# Patient Record
Sex: Female | Born: 1950 | Race: White | Hispanic: No | Marital: Married | Smoking: Never smoker
Health system: Southern US, Community
[De-identification: ages and names within clinical notes are randomized; demographics above are authoritative.]

---

## 1999-04-14 ENCOUNTER — Encounter: Payer: Self-pay | Admitting: Family Medicine

## 1999-04-14 ENCOUNTER — Encounter: Admission: RE | Admit: 1999-04-14 | Discharge: 1999-04-14 | Payer: Self-pay | Admitting: Family Medicine

## 2000-04-06 ENCOUNTER — Encounter: Admission: RE | Admit: 2000-04-06 | Discharge: 2000-04-06 | Payer: Self-pay | Admitting: Family Medicine

## 2000-04-06 ENCOUNTER — Encounter: Payer: Self-pay | Admitting: Family Medicine

## 2001-08-09 ENCOUNTER — Encounter: Admission: RE | Admit: 2001-08-09 | Discharge: 2001-08-09 | Payer: Self-pay | Admitting: Family Medicine

## 2001-08-09 ENCOUNTER — Encounter: Payer: Self-pay | Admitting: Family Medicine

## 2001-10-22 ENCOUNTER — Encounter: Payer: Self-pay | Admitting: Family Medicine

## 2001-10-22 ENCOUNTER — Encounter: Admission: RE | Admit: 2001-10-22 | Discharge: 2001-10-22 | Payer: Self-pay | Admitting: Family Medicine

## 2001-10-23 ENCOUNTER — Encounter: Payer: Self-pay | Admitting: Internal Medicine

## 2001-10-23 ENCOUNTER — Ambulatory Visit (HOSPITAL_COMMUNITY): Admission: RE | Admit: 2001-10-23 | Discharge: 2001-10-23 | Payer: Self-pay | Admitting: Internal Medicine

## 2002-02-05 ENCOUNTER — Encounter: Admission: RE | Admit: 2002-02-05 | Discharge: 2002-02-05 | Payer: Self-pay | Admitting: Neurological Surgery

## 2002-02-05 ENCOUNTER — Encounter: Payer: Self-pay | Admitting: Neurological Surgery

## 2002-12-29 ENCOUNTER — Ambulatory Visit (HOSPITAL_COMMUNITY): Admission: RE | Admit: 2002-12-29 | Discharge: 2002-12-29 | Payer: Self-pay | Admitting: Neurological Surgery

## 2002-12-29 ENCOUNTER — Encounter: Payer: Self-pay | Admitting: Neurological Surgery

## 2003-02-02 ENCOUNTER — Inpatient Hospital Stay (HOSPITAL_COMMUNITY): Admission: RE | Admit: 2003-02-02 | Discharge: 2003-02-06 | Payer: Self-pay | Admitting: Neurological Surgery

## 2004-03-29 ENCOUNTER — Other Ambulatory Visit: Admission: RE | Admit: 2004-03-29 | Discharge: 2004-03-29 | Payer: Self-pay | Admitting: Family Medicine

## 2004-03-29 ENCOUNTER — Encounter: Admission: RE | Admit: 2004-03-29 | Discharge: 2004-03-29 | Payer: Self-pay | Admitting: Family Medicine

## 2004-04-05 ENCOUNTER — Ambulatory Visit (HOSPITAL_COMMUNITY): Admission: RE | Admit: 2004-04-05 | Discharge: 2004-04-05 | Payer: Self-pay | Admitting: Family Medicine

## 2006-01-31 ENCOUNTER — Ambulatory Visit: Payer: Self-pay | Admitting: Internal Medicine

## 2006-02-14 ENCOUNTER — Ambulatory Visit: Payer: Self-pay | Admitting: Internal Medicine

## 2009-02-10 ENCOUNTER — Encounter: Admission: RE | Admit: 2009-02-10 | Discharge: 2009-02-10 | Payer: Self-pay | Admitting: Family Medicine

## 2010-04-16 ENCOUNTER — Encounter: Payer: Self-pay | Admitting: Family Medicine

## 2010-08-12 NOTE — Discharge Summary (Signed)
   NAME:  Abigail Mullins, Abigail Mullins                  ACCOUNT NO.:  [id number]   MEDICAL RECORD NO.:  [id number]                   PATIENT TYPE:  INP   LOCATION:  3007                                 FACILITY:  [facility]   PHYSICIAN:  Stefani Dama, M.D.               DATE OF BIRTH:  04/15/1950   DATE OF ADMISSION:  02/02/2003  DATE OF DISCHARGE:  02/06/2003                                 DISCHARGE SUMMARY   ADMISSION DIAGNOSES:  1. 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  2. Status post arthrodesis T12-L4 for L1 burst fracture in 1988.   DISCHARGE DIAGNOSES/FINAL DIAGNOSES:  1. 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  2. Status post arthrodesis T12-L4 for L1 burst fracture in 1988.   PROCEDURE:  Exploration of arthrodesis T12 to L4 with removal of Harrington  hardware from T12 to L4.  Decompression of lumbar stenosis and posterior  interbody arthrodesis using Saber carbon fiber cages and local Autograft, L4-  L5, posterior lateral arthrodesis with local Autograft and Allograft.  Pedicle screw fixation, non-segmental L4, L5 on February 06, 2003.   CONDITION ON DISCHARGE:  Improving.   HOSPITAL COURSE:  The patient is a 60 year old individual who has had  significant back pain and bilateral leg pain.  She has had significant  problems with her lumbar spine having suffered a compression fracture at the  L1 vertebrae years ago.  She underwent posterior fixation and fusion with  Harrington rod technique.  She recovered from this well, however, she  developed problems at L5-S1 a few years ago and she underwent decompression  of this.  She also had a synovial cyst develop at L4-L5 and this was  decompressed a few years ago.  She did well in the interim, however, she has  developed recurrent back pain and leg pain and had severe spondylitic  disease at L4-L5.  She has been advised regarding surgical decompression and  stabilization, however this would require removal of her Harrington  hardware.  This was performed on the 8th.  She tolerated this procedure well  and postoperatively she has been doing reasonably well.  Her incision is  clean and dry and at this time she is managed with Percocet for pain.  She  will be seen in the office in two weeks' time.  Her incision is clean and  dry and she continues to improve steadily.                                                Stefani Dama, M.D.    Merla Riches  D:  02/05/2003  T:  02/06/2003  Job:  [id number]

## 2010-08-12 NOTE — Op Note (Signed)
NAME:  LILEIGH, FAHRINGER                  ACCOUNT NO.:  [id number]   MEDICAL RECORD NO.:  [id number]                   PATIENT TYPE:  INP   LOCATION:  2899                                 FACILITY:  [facility]   PHYSICIAN:  Stefani Dama, M.D.               DATE OF BIRTH:  07/21/1950   DATE OF PROCEDURE:  02/02/2003  DATE OF DISCHARGE:                                 OPERATIVE REPORT   PREOPERATIVE DIAGNOSES:  1. L4-L5 spondylosis and stenosis.  2. Lumbar radiculopathy and back pain.  3. Status post arthrodesis T12 to L4 for L1 burst fracture in 1988.   POSTOPERATIVE DIAGNOSES:  1. L4-L5 spondylosis and stenosis.  2. Lumbar radiculopathy and back pain.  3. Status post arthrodesis T12 to L4 for L1 burst fracture in 1988.   PROCEDURE:  1. Exploration of arthrodesis T12 to L4.  2. Removal of Harrington hardware.  3. Decompression of lumbar stenosis with posterior interbody arthrodesis     using Saber carbon fiber cages and local autograft.  4. Pedicle screw fixation L4-L5.  5. Posterior lateral arthrodesis with local autograft and allograft.   SURGEON:  Stefani Dama, M.D.   ASSISTANT:  Cristi Loron, M.D.   ANESTHESIA:  General endotracheal.   INDICATIONS FOR PROCEDURE:  The patient is a 60 year old individual who has  had significant back and bilateral leg pain.  She has previously had surgery  to stabilize and fuse from T11 down to L4 for an L1 compression fracture.  She has also had an arthrodesis at L5-S1 with a cage technique.  She has had  progressive spondylitic disease, and has had a previous resection of a  synovial cyst at the level of L4-L5.  She did well after each of these  operations; however, she has had recurrent back pain and bilateral leg pain,  and now has severe spondylitic stenosis at L4-L5.  She has been advised  regarding surgery, and I have advised that she undergo a removal of the  Harrington rods and then stabilization and fixation at  L4-L5 using a  posterior interbody technique and a posterolateral arthrodesis after placing  pedicle screws.   DESCRIPTION OF PROCEDURE:  The patient was brought to the operating room  supine on a stretcher.  After the smooth induction of general endotracheal  anesthesia, she was turned prone.  The back was shaved and prepped with  Duraprep and draped in a sterile fashion.  An elliptical incision was made  around the previous scar and this was excised.  The dissection was then  carried down to the subcutaneous tissues and the lumbodorsal fascia was then  opened on either side of the midline.  This exposed the fusion mass in the  posterior aspect of her spine, and then ultimately we found the rod on one  side, and dissected this down to expose the rod, and at the level of the L4  vertebra.  The dissection was then carried  out cephalad to expose the rod  hooks at the T11 vertebra.  Care was taken to dissect around the rods, and  then an osteotome was used to release bony growth that had incurred into the  rods and into the hooks.  By gradually releasing these keepers, the superior  hooks were identified.  These were expanded and then removed.  Then by  placing a hook holder, the hook could be maneuvered to unratchet the rod,  and then using a combination of curets, rongeurs and osteotomes, the rod was  gradually dislodged in the extension position through the superior rod, and  then removed.  Then the hooks were each removed on one side.  A similar  procedure was then carried out on the other side.  During this maneuver, the  arthrodesis could be explored, and it was noted that there was a solid  fusion all the way from T11 down to the L4 vertebra.  Once the hardware was  removed, the area was packed off and an exploration of the interbody space  at L4-L5 was then undertaken.  A significant overgrowth of ligament of  flavum and facet at this level had occurred.  This was first opened up on   the right side, where the ligamentum was identified and carefully dissected  to expose the common dural tube and the takeoff of the L5 nerve root as it  went under the foramen.  This area was then decompressed fully in the  cephalad position, and the bone from this decompression was saved for later  use in the arthrodesis.  This was first started on the patient's left-hand  side, and then proceeded on the patient's right-hand side.  A decompression  was done bilaterally so that the disk space could then be isolated.  Then a  diskectomy was performed, first on the right side, then a significant  quantity of severely degenerated disk material was removed from within the  disk space, and this allowed placement of a self-retaining disk spreader  into that side.  The contralateral side then underwent a diskectomy.  This  area was similarly decompressed fully.  Then by working from side to side,  the disk space was completely evacuated of a significant quantity of  severely degenerated disk material.  The disk material was removed both from  medially and laterally, all the way until the posterior longitudinal  ligament could be identified around the vertebrae.  Once this was performed,  the end plates were curetted until smooth, and the bony end plates were then  prepared for grafting.  A series of rasps were then used both medially and  laterally, to make sure that all the remnants of cartilagineus material had  been removed.  Once this was secured, the attention was then turned to sizing the  appropriate end plate.  It was felt that a 9 mm trial allowed for some snug  placement of the disk space, with a good maintenance of the disk height.  Two 9 mm x 25 mm end plates were then selected and impacted, once they were  packed with the patient's own bone.  The patient also used the Symphony system which allowed for a platelet-rich glue to be mixed with autograft and  allograft, and this was then  packed into each of the cages.  The interspace  was then also packed with the patient's autograft and some of the Symphony  allograft.  The interbody space was then filled with this material.  The  medical entry sites were then chosen at L4 and L5.  These were checked  radiographically.  When felt to be adequate, each of the pedicle holes was  then tapped with the 5.5 mm tap, and then 6.5 mm screws were placed in the  holes.  On the upper left hole at L4 the 6.5 screw was noted to have  stripped out, and the 7.0 mm screw was used.  These were then secured with  the appropriate end plates and caps.  Prior to doing this the lateral  gutters, which had earlier been decorticated and packed off, were packed  with the remainder of the autograft and allograft.  The screw caps were  applied, and then placed in slight compression.  This allowed for a good  reduction and anatomic appearance to the construct.  The system was then  locked down fully.  The soft tissues were then carefully checked for  hemostasis.  The area was copiously irrigated with antibiotic irrigating  solution.  The remainder of the bone graft was then placed into the bed  where the previous rods had been removed from, and then the lumbodorsal  fascia was closed with #1 Vicryl in an interrupted fashion, and #2-0 Vicryl  was used in the subcutaneous and subcuticular tissues, and #3-0 Vicryl was  used to close the subcuticular skin.  The patient tolerated the procedure well and was returned to the recovery  room in stable condition.  One unit Cell-Saver blood was returned to the  patient.                                               Stefani Dama, M.D.    Merla Riches  D:  02/02/2003  T:  02/02/2003  Job:  [id number]

## 2011-03-30 ENCOUNTER — Encounter: Payer: Self-pay | Admitting: Internal Medicine

## 2011-12-20 ENCOUNTER — Encounter: Payer: Self-pay | Admitting: Internal Medicine

## 2013-12-22 ENCOUNTER — Other Ambulatory Visit (HOSPITAL_COMMUNITY): Payer: Self-pay | Admitting: Family Medicine

## 2013-12-22 ENCOUNTER — Encounter (HOSPITAL_COMMUNITY): Payer: Self-pay

## 2013-12-22 ENCOUNTER — Ambulatory Visit (HOSPITAL_COMMUNITY)
Admission: RE | Admit: 2013-12-22 | Discharge: 2013-12-22 | Disposition: A | Discharge status: Home / Self Care | Payer: MEDICARE | Source: Ambulatory Visit | Attending: Family Medicine | Admitting: Family Medicine

## 2013-12-22 ENCOUNTER — Other Ambulatory Visit: Payer: Self-pay | Admitting: Family Medicine

## 2013-12-22 DIAGNOSIS — R197 Diarrhea, unspecified: Secondary | ICD-10-CM | POA: Insufficient documentation

## 2013-12-22 DIAGNOSIS — R1031 Right lower quadrant pain: Secondary | ICD-10-CM | POA: Diagnosis present

## 2013-12-22 DIAGNOSIS — B029 Zoster without complications: Secondary | ICD-10-CM | POA: Diagnosis not present

## 2013-12-22 MED ORDER — IOHEXOL 300 MG/ML  SOLN
100.0000 mL | Freq: Once | INTRAMUSCULAR | Status: AC | PRN
  Administered 2013-12-22: 100 mL via INTRAVENOUS

## 2014-02-16 ENCOUNTER — Other Ambulatory Visit (HOSPITAL_COMMUNITY): Payer: Self-pay | Admitting: Family Medicine

## 2014-02-16 DIAGNOSIS — J984 Other disorders of lung: Secondary | ICD-10-CM

## 2014-03-03 ENCOUNTER — Ambulatory Visit (HOSPITAL_COMMUNITY): Payer: BC Managed Care – PPO

## 2014-05-12 ENCOUNTER — Other Ambulatory Visit: Payer: Self-pay | Admitting: Family Medicine

## 2014-05-12 DIAGNOSIS — R911 Solitary pulmonary nodule: Secondary | ICD-10-CM

## 2014-05-18 ENCOUNTER — Ambulatory Visit
Admission: RE | Admit: 2014-05-18 | Discharge: 2014-05-18 | Disposition: A | Discharge status: Home / Self Care | Payer: Medicare Other | Source: Ambulatory Visit | Attending: Family Medicine | Admitting: Family Medicine

## 2014-05-18 DIAGNOSIS — R911 Solitary pulmonary nodule: Secondary | ICD-10-CM

## 2015-09-29 ENCOUNTER — Other Ambulatory Visit: Payer: Self-pay | Admitting: Family Medicine

## 2015-09-29 DIAGNOSIS — Z1231 Encounter for screening mammogram for malignant neoplasm of breast: Secondary | ICD-10-CM

## 2015-10-05 ENCOUNTER — Ambulatory Visit
Admission: RE | Admit: 2015-10-05 | Discharge: 2015-10-05 | Disposition: A | Discharge status: Home / Self Care | Payer: Medicare Other | Source: Ambulatory Visit | Attending: Family Medicine | Admitting: Family Medicine

## 2015-10-05 DIAGNOSIS — Z1231 Encounter for screening mammogram for malignant neoplasm of breast: Secondary | ICD-10-CM | POA: Diagnosis not present

## 2015-10-07 DIAGNOSIS — M479 Spondylosis, unspecified: Secondary | ICD-10-CM | POA: Diagnosis not present

## 2016-11-22 IMAGING — CT CT CHEST W/O CM
1 of 4 series · 15 of 30 positions shown, 19 images · non-contrast
Comparison: 12/22/2013

CLINICAL DATA: Followup left lower lobe lung nodule

EXAM:
CT CHEST WITHOUT CONTRAST
TECHNIQUE: Multidetector CT imaging of the chest was performed following the
standard protocol without IV contrast..

[Series 2: thin chest · axial · 0.64mm/px · z∈[-300,+4]mm · 15 of 348 slices shown, 19 images]
[im 22/348  mediastinal]
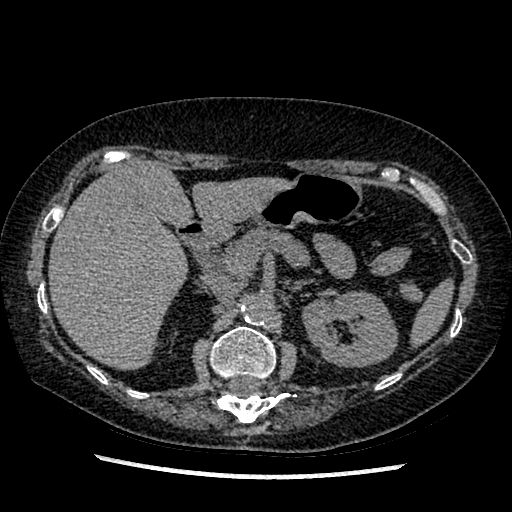
[im 22/348  lung]
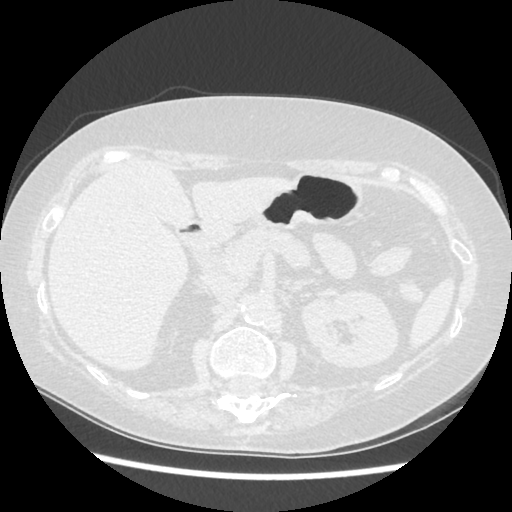
[im 44/348  lung]
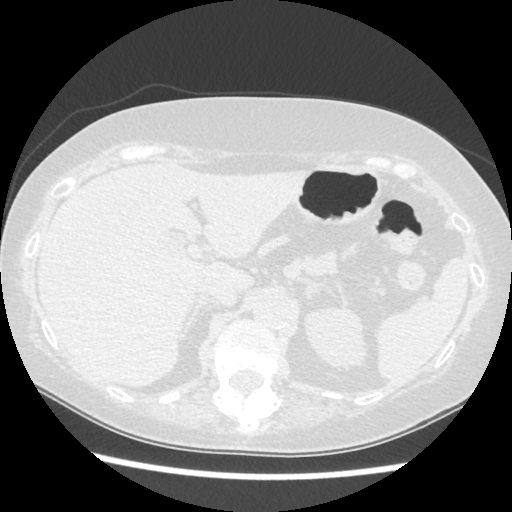
[im 66/348  lung]
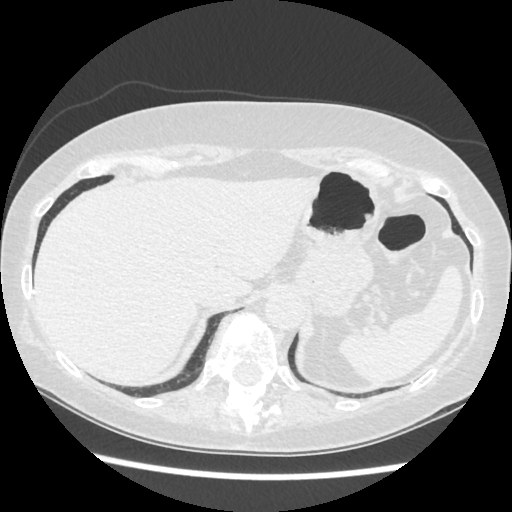
[im 87/348  lung]
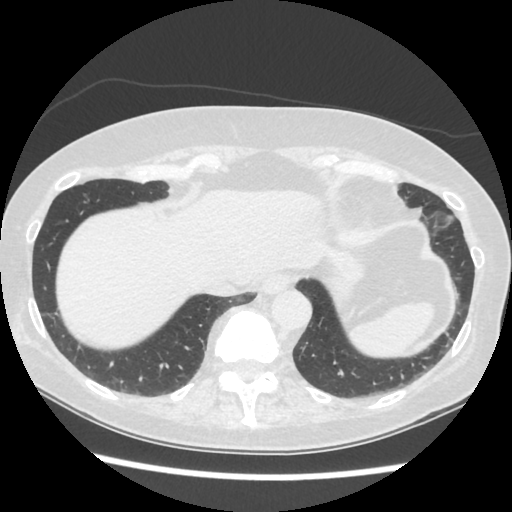
[im 109/348  mediastinal]
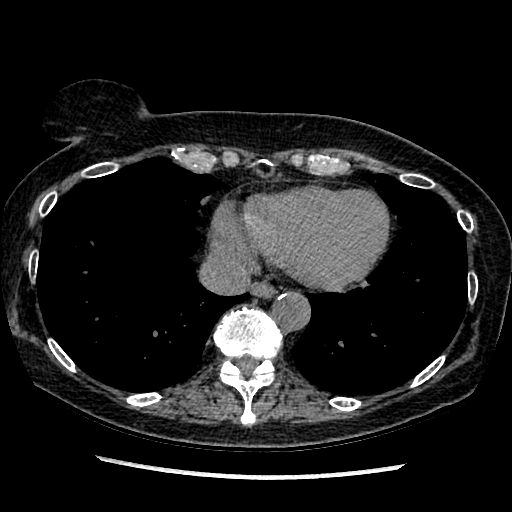
[im 109/348  lung]
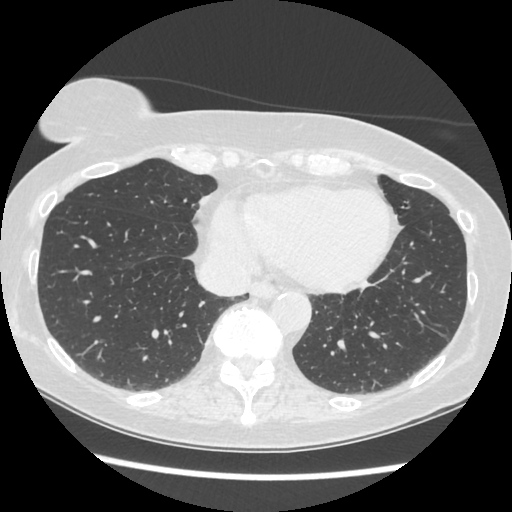
[im 131/348  lung]
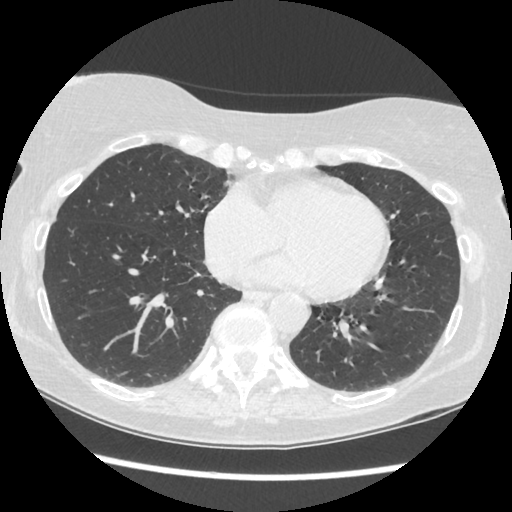
[im 152/348  lung]
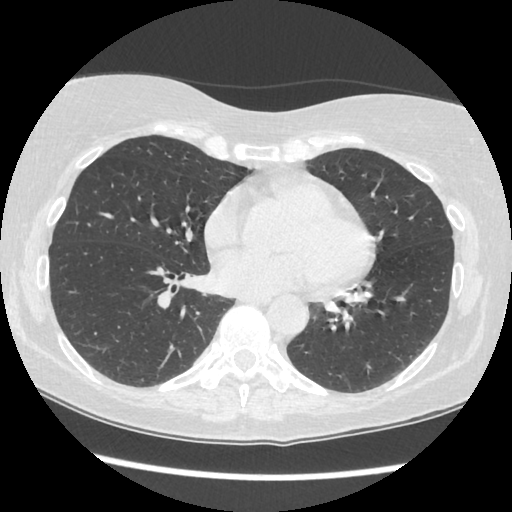
[im 174/348  lung]
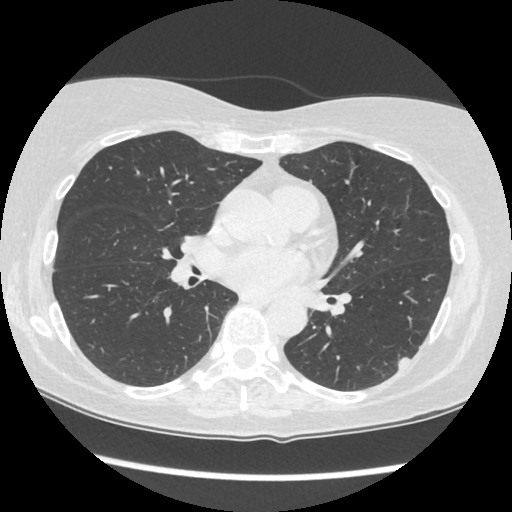
[im 196/348  mediastinal]
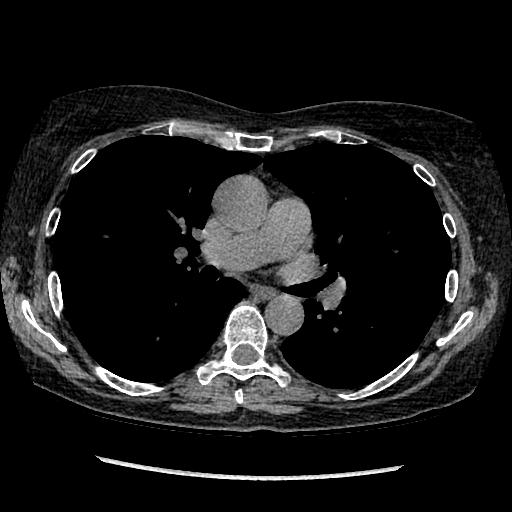
[im 196/348  lung]
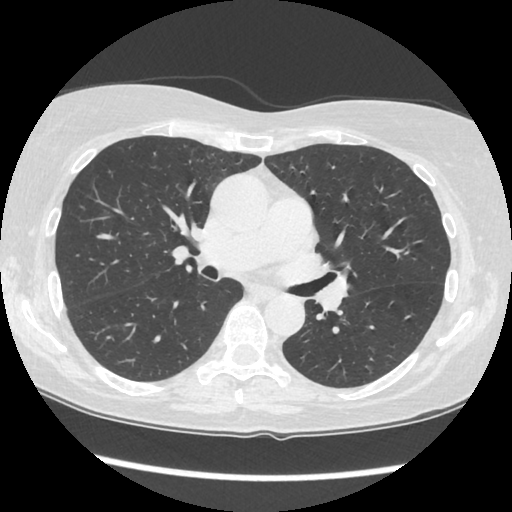
[im 217/348  lung]
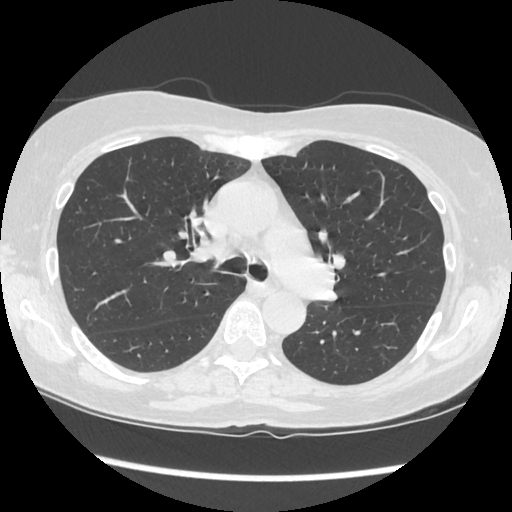
[im 239/348  lung]
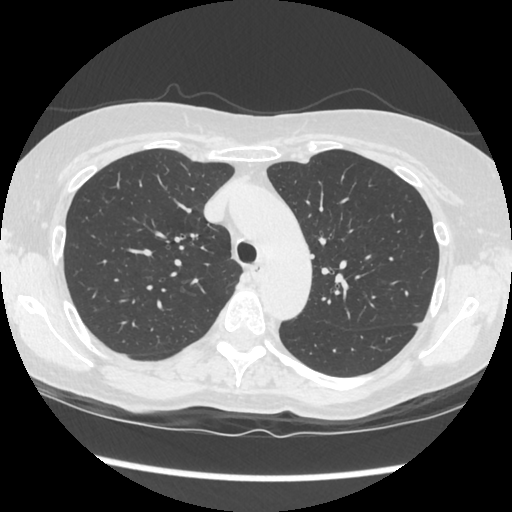
[im 261/348  lung]
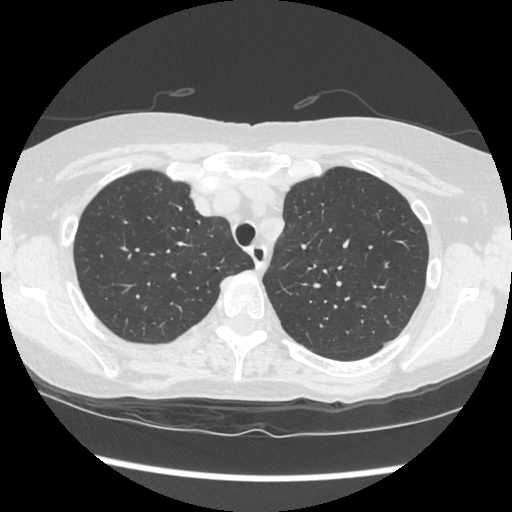
[im 282/348  mediastinal]
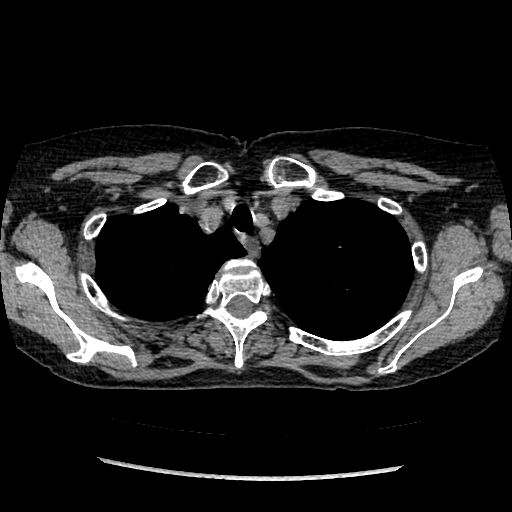
[im 282/348  lung]
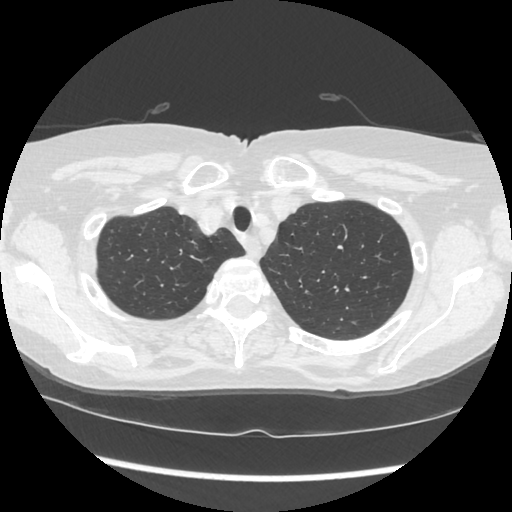
[im 304/348  lung]
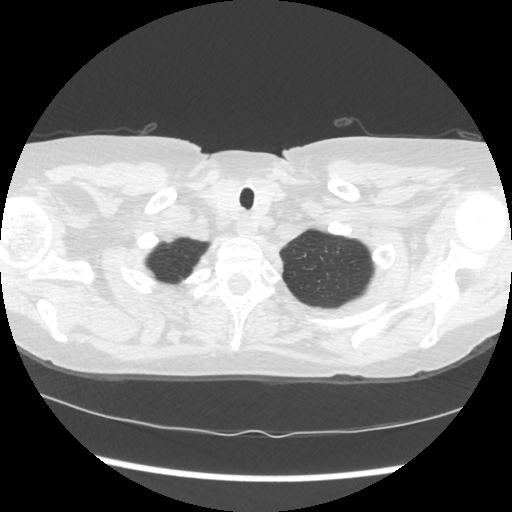
[im 326/348  lung]
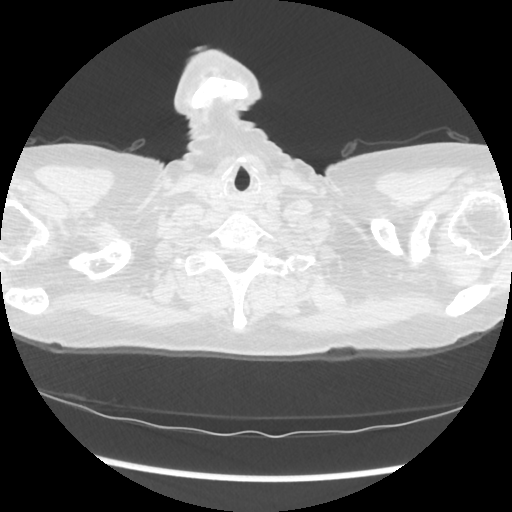

[15 of 30 positions shown; findings below may reference images not displayed]

FINDINGS: The lungs are well aerated bilaterally. The previously seen
subpleural nodule is again noted in the left lower lobe measuring
1.2 cm. No significant interval change is noted. A tiny noncalcified
nodule is noted in the right upper lobe best seen on image number 13
of series 4. It measures approximately 3 mm. No other significant
nodules are seen. The hilar and mediastinal structures show no
significant lymphadenopathy. Aortic and coronary calcifications are
seen.

Scanning into the upper abdomen reveals no acute abnormality.
Degenerative changes in the thoracic spine are noted.
IMPRESSION: Stable left lower lobe subpleural nodule. Continued followup in 1
year is recommended to assess for stability.

3 mm nodule in the right upper lobe as described. If the patient is
at high risk for bronchogenic carcinoma, follow-up chest CT at 1
year is recommended. If the patient is at low risk, no follow-up is
needed. This recommendation follows the consensus statement:
Guidelines for Management of Small Pulmonary Nodules Detected on CT
Scans: A Statement from the [HOSPITAL] as published in
Radiology 0666; [DATE]. This can be performed at the same
setting for evaluation of the larger nodule.

## 2016-11-28 ENCOUNTER — Other Ambulatory Visit: Payer: Self-pay | Admitting: Family Medicine

## 2016-11-28 DIAGNOSIS — Z1231 Encounter for screening mammogram for malignant neoplasm of breast: Secondary | ICD-10-CM

## 2016-12-07 ENCOUNTER — Ambulatory Visit: Payer: Medicare Other

## 2017-01-24 DIAGNOSIS — Z7189 Other specified counseling: Secondary | ICD-10-CM | POA: Diagnosis not present

## 2017-01-24 DIAGNOSIS — M479 Spondylosis, unspecified: Secondary | ICD-10-CM | POA: Diagnosis not present

## 2017-05-21 DIAGNOSIS — R69 Illness, unspecified: Secondary | ICD-10-CM | POA: Diagnosis not present

## 2017-10-17 DIAGNOSIS — Z0000 Encounter for general adult medical examination without abnormal findings: Secondary | ICD-10-CM | POA: Diagnosis not present

## 2018-01-24 DIAGNOSIS — Z7189 Other specified counseling: Secondary | ICD-10-CM | POA: Diagnosis not present

## 2018-02-01 DIAGNOSIS — Z23 Encounter for immunization: Secondary | ICD-10-CM | POA: Diagnosis not present

## 2018-05-30 DIAGNOSIS — L821 Other seborrheic keratosis: Secondary | ICD-10-CM | POA: Diagnosis not present

## 2018-05-30 DIAGNOSIS — C44319 Basal cell carcinoma of skin of other parts of face: Secondary | ICD-10-CM | POA: Diagnosis not present

## 2018-05-31 DIAGNOSIS — I10 Essential (primary) hypertension: Secondary | ICD-10-CM | POA: Diagnosis not present

## 2018-08-06 DIAGNOSIS — Z85828 Personal history of other malignant neoplasm of skin: Secondary | ICD-10-CM | POA: Diagnosis not present

## 2018-08-06 DIAGNOSIS — C44319 Basal cell carcinoma of skin of other parts of face: Secondary | ICD-10-CM | POA: Diagnosis not present

## 2018-10-17 DIAGNOSIS — M10072 Idiopathic gout, left ankle and foot: Secondary | ICD-10-CM | POA: Diagnosis not present

## 2018-10-17 DIAGNOSIS — M79672 Pain in left foot: Secondary | ICD-10-CM | POA: Diagnosis not present

## 2018-10-17 DIAGNOSIS — G5762 Lesion of plantar nerve, left lower limb: Secondary | ICD-10-CM | POA: Diagnosis not present

## 2018-12-13 DIAGNOSIS — R5383 Other fatigue: Secondary | ICD-10-CM | POA: Diagnosis not present

## 2018-12-13 DIAGNOSIS — Z1211 Encounter for screening for malignant neoplasm of colon: Secondary | ICD-10-CM | POA: Diagnosis not present

## 2018-12-13 DIAGNOSIS — I10 Essential (primary) hypertension: Secondary | ICD-10-CM | POA: Diagnosis not present

## 2018-12-13 DIAGNOSIS — Z1231 Encounter for screening mammogram for malignant neoplasm of breast: Secondary | ICD-10-CM | POA: Diagnosis not present

## 2019-01-15 DIAGNOSIS — M47816 Spondylosis without myelopathy or radiculopathy, lumbar region: Secondary | ICD-10-CM | POA: Diagnosis not present

## 2019-01-15 DIAGNOSIS — I10 Essential (primary) hypertension: Secondary | ICD-10-CM | POA: Diagnosis not present

## 2019-02-12 DIAGNOSIS — Z1211 Encounter for screening for malignant neoplasm of colon: Secondary | ICD-10-CM | POA: Diagnosis not present

## 2019-02-13 DIAGNOSIS — L821 Other seborrheic keratosis: Secondary | ICD-10-CM | POA: Diagnosis not present

## 2019-02-13 DIAGNOSIS — L814 Other melanin hyperpigmentation: Secondary | ICD-10-CM | POA: Diagnosis not present

## 2019-02-13 DIAGNOSIS — Z85828 Personal history of other malignant neoplasm of skin: Secondary | ICD-10-CM | POA: Diagnosis not present

## 2019-02-13 DIAGNOSIS — C44612 Basal cell carcinoma of skin of right upper limb, including shoulder: Secondary | ICD-10-CM | POA: Diagnosis not present

## 2019-02-13 DIAGNOSIS — L570 Actinic keratosis: Secondary | ICD-10-CM | POA: Diagnosis not present

## 2019-02-26 ENCOUNTER — Other Ambulatory Visit: Payer: Self-pay | Admitting: Family Medicine

## 2019-02-26 DIAGNOSIS — Z1231 Encounter for screening mammogram for malignant neoplasm of breast: Secondary | ICD-10-CM

## 2019-05-27 DIAGNOSIS — Z85828 Personal history of other malignant neoplasm of skin: Secondary | ICD-10-CM | POA: Diagnosis not present

## 2019-05-27 DIAGNOSIS — L570 Actinic keratosis: Secondary | ICD-10-CM | POA: Diagnosis not present

## 2019-05-27 DIAGNOSIS — L821 Other seborrheic keratosis: Secondary | ICD-10-CM | POA: Diagnosis not present

## 2019-05-27 DIAGNOSIS — C44612 Basal cell carcinoma of skin of right upper limb, including shoulder: Secondary | ICD-10-CM | POA: Diagnosis not present

## 2019-06-16 DIAGNOSIS — Z1231 Encounter for screening mammogram for malignant neoplasm of breast: Secondary | ICD-10-CM | POA: Diagnosis not present

## 2019-06-16 DIAGNOSIS — I10 Essential (primary) hypertension: Secondary | ICD-10-CM | POA: Diagnosis not present

## 2019-06-16 DIAGNOSIS — R140 Abdominal distension (gaseous): Secondary | ICD-10-CM | POA: Diagnosis not present

## 2019-06-16 DIAGNOSIS — R7301 Impaired fasting glucose: Secondary | ICD-10-CM | POA: Diagnosis not present

## 2019-07-18 DIAGNOSIS — H02889 Meibomian gland dysfunction of unspecified eye, unspecified eyelid: Secondary | ICD-10-CM | POA: Diagnosis not present

## 2019-07-18 DIAGNOSIS — H2513 Age-related nuclear cataract, bilateral: Secondary | ICD-10-CM | POA: Diagnosis not present

## 2019-07-18 DIAGNOSIS — H15001 Unspecified scleritis, right eye: Secondary | ICD-10-CM | POA: Diagnosis not present

## 2019-07-21 ENCOUNTER — Ambulatory Visit: Payer: Medicare Other

## 2019-08-07 ENCOUNTER — Other Ambulatory Visit: Payer: Self-pay

## 2019-08-07 ENCOUNTER — Ambulatory Visit
Admission: RE | Admit: 2019-08-07 | Discharge: 2019-08-07 | Disposition: A | Discharge status: Home / Self Care | Payer: Medicare Other | Source: Ambulatory Visit | Attending: Family Medicine | Admitting: Family Medicine

## 2019-08-07 DIAGNOSIS — Z1231 Encounter for screening mammogram for malignant neoplasm of breast: Secondary | ICD-10-CM

## 2019-12-29 DIAGNOSIS — Z1211 Encounter for screening for malignant neoplasm of colon: Secondary | ICD-10-CM | POA: Diagnosis not present

## 2019-12-29 DIAGNOSIS — Z0000 Encounter for general adult medical examination without abnormal findings: Secondary | ICD-10-CM | POA: Diagnosis not present

## 2019-12-29 DIAGNOSIS — I10 Essential (primary) hypertension: Secondary | ICD-10-CM | POA: Diagnosis not present

## 2019-12-29 DIAGNOSIS — R7301 Impaired fasting glucose: Secondary | ICD-10-CM | POA: Diagnosis not present

## 2020-01-14 DIAGNOSIS — Z7189 Other specified counseling: Secondary | ICD-10-CM | POA: Diagnosis not present

## 2020-02-11 DIAGNOSIS — I10 Essential (primary) hypertension: Secondary | ICD-10-CM | POA: Diagnosis not present

## 2020-02-11 DIAGNOSIS — Z9889 Other specified postprocedural states: Secondary | ICD-10-CM | POA: Diagnosis not present

## 2020-02-11 DIAGNOSIS — G894 Chronic pain syndrome: Secondary | ICD-10-CM | POA: Diagnosis not present

## 2020-05-11 DIAGNOSIS — I10 Essential (primary) hypertension: Secondary | ICD-10-CM | POA: Diagnosis not present

## 2020-05-11 DIAGNOSIS — M479 Spondylosis, unspecified: Secondary | ICD-10-CM | POA: Diagnosis not present

## 2020-05-11 DIAGNOSIS — G894 Chronic pain syndrome: Secondary | ICD-10-CM | POA: Diagnosis not present

## 2020-05-11 DIAGNOSIS — Z9889 Other specified postprocedural states: Secondary | ICD-10-CM | POA: Diagnosis not present

## 2020-05-24 DIAGNOSIS — H02889 Meibomian gland dysfunction of unspecified eye, unspecified eyelid: Secondary | ICD-10-CM | POA: Diagnosis not present

## 2020-05-24 DIAGNOSIS — H15001 Unspecified scleritis, right eye: Secondary | ICD-10-CM | POA: Diagnosis not present

## 2020-05-24 DIAGNOSIS — H2513 Age-related nuclear cataract, bilateral: Secondary | ICD-10-CM | POA: Diagnosis not present

## 2020-06-17 DIAGNOSIS — I10 Essential (primary) hypertension: Secondary | ICD-10-CM | POA: Diagnosis not present

## 2020-06-17 DIAGNOSIS — R350 Frequency of micturition: Secondary | ICD-10-CM | POA: Diagnosis not present

## 2020-06-17 DIAGNOSIS — R0981 Nasal congestion: Secondary | ICD-10-CM | POA: Diagnosis not present

## 2020-06-17 DIAGNOSIS — R5383 Other fatigue: Secondary | ICD-10-CM | POA: Diagnosis not present

## 2020-06-23 ENCOUNTER — Telehealth: Payer: Self-pay | Admitting: *Deleted

## 2020-06-23 NOTE — Telephone Encounter (Signed)
Per referral Dr. Manus Gunning - called and gave upcoming appointment and mailed calendar

## 2020-07-23 ENCOUNTER — Inpatient Hospital Stay: Payer: Medicare Other | Attending: Family | Admitting: Family

## 2020-07-23 ENCOUNTER — Inpatient Hospital Stay: Payer: Medicare Other

## 2020-07-23 ENCOUNTER — Other Ambulatory Visit: Payer: Self-pay

## 2020-07-23 ENCOUNTER — Other Ambulatory Visit: Payer: Self-pay | Admitting: Family

## 2020-07-23 VITALS — BP 132/71 | HR 81

## 2020-07-23 DIAGNOSIS — Z803 Family history of malignant neoplasm of breast: Secondary | ICD-10-CM | POA: Insufficient documentation

## 2020-07-23 DIAGNOSIS — Z808 Family history of malignant neoplasm of other organs or systems: Secondary | ICD-10-CM | POA: Insufficient documentation

## 2020-07-23 DIAGNOSIS — D751 Secondary polycythemia: Secondary | ICD-10-CM

## 2020-07-23 DIAGNOSIS — F17210 Nicotine dependence, cigarettes, uncomplicated: Secondary | ICD-10-CM | POA: Diagnosis not present

## 2020-07-23 DIAGNOSIS — I10 Essential (primary) hypertension: Secondary | ICD-10-CM | POA: Insufficient documentation

## 2020-07-23 DIAGNOSIS — R718 Other abnormality of red blood cells: Secondary | ICD-10-CM | POA: Insufficient documentation

## 2020-07-23 DIAGNOSIS — Z85828 Personal history of other malignant neoplasm of skin: Secondary | ICD-10-CM | POA: Insufficient documentation

## 2020-07-23 LAB — CBC WITH DIFFERENTIAL (CANCER CENTER ONLY)
Abs Immature Granulocytes: 0.04 10*3/uL (ref 0.00–0.07)
Basophils Absolute: 0 10*3/uL (ref 0.0–0.1)
Basophils Relative: 1 %
Eosinophils Absolute: 0.1 10*3/uL (ref 0.0–0.5)
Eosinophils Relative: 1 %
HCT: 60.1 % — ABNORMAL HIGH (ref 36.0–46.0)
Hemoglobin: 20.1 g/dL — ABNORMAL HIGH (ref 12.0–15.0)
Immature Granulocytes: 1 %
Lymphocytes Relative: 24 %
Lymphs Abs: 2 10*3/uL (ref 0.7–4.0)
MCH: 33.3 pg (ref 26.0–34.0)
MCHC: 33.4 g/dL (ref 30.0–36.0)
MCV: 99.5 fL (ref 80.0–100.0)
Monocytes Absolute: 0.7 10*3/uL (ref 0.1–1.0)
Monocytes Relative: 8 %
Neutro Abs: 5.4 10*3/uL (ref 1.7–7.7)
Neutrophils Relative %: 65 %
Platelet Count: 283 10*3/uL (ref 150–400)
RBC: 6.04 MIL/uL — ABNORMAL HIGH (ref 3.87–5.11)
RDW: 12.2 % (ref 11.5–15.5)
WBC Count: 8.2 10*3/uL (ref 4.0–10.5)
nRBC: 0 % (ref 0.0–0.2)

## 2020-07-23 LAB — CMP (CANCER CENTER ONLY)
ALT: 35 U/L (ref 0–44)
AST: 32 U/L (ref 15–41)
Albumin: 4.8 g/dL (ref 3.5–5.0)
Alkaline Phosphatase: 72 U/L (ref 38–126)
Anion gap: 6 (ref 5–15)
BUN: 11 mg/dL (ref 8–23)
CO2: 33 mmol/L — ABNORMAL HIGH (ref 22–32)
Calcium: 10.2 mg/dL (ref 8.9–10.3)
Chloride: 100 mmol/L (ref 98–111)
Creatinine: 0.7 mg/dL (ref 0.44–1.00)
GFR, Estimated: >60 mL/min (ref >60)
Glucose, Bld: 133 mg/dL — ABNORMAL HIGH (ref 70–99)
Potassium: 4.4 mmol/L (ref 3.5–5.1)
Sodium: 139 mmol/L (ref 135–145)
Total Bilirubin: 0.8 mg/dL (ref 0.3–1.2)
Total Protein: 8.1 g/dL (ref 6.5–8.1)

## 2020-07-23 LAB — IRON AND TIBC
Iron: 218 ug/dL — ABNORMAL HIGH (ref 28–170)
Saturation Ratios: 52 % — ABNORMAL HIGH (ref 10.4–31.8)
TIBC: 421 ug/dL (ref 250–450)
UIBC: 203 ug/dL

## 2020-07-23 LAB — RETICULOCYTES
Immature Retic Fract: 5.9 % (ref 2.3–15.9)
RBC.: 6.07 MIL/uL — ABNORMAL HIGH (ref 3.87–5.11)
Retic Count, Absolute: 72.8 10*3/uL (ref 19.0–186.0)
Retic Ct Pct: 1.2 % (ref 0.4–3.1)

## 2020-07-23 LAB — LACTATE DEHYDROGENASE: LDH: 136 U/L (ref 98–192)

## 2020-07-23 LAB — FERRITIN: Ferritin: 201 ng/mL (ref 11–307)

## 2020-07-23 LAB — SAVE SMEAR (SSMR)

## 2020-07-23 NOTE — Patient Instructions (Signed)

## 2020-07-23 NOTE — Progress Notes (Signed)
Hematology/Oncology Consultation   Name: Abigail Mullins      MRN: [id number]    Location: Room/bed info not found  Date: 07/23/2020 Time:12:12 PM   REFERRING PHYSICIAN: Blair Heys, MD  REASON FOR CONSULT: Elevated Hgb, malaise and fatigue    DIAGNOSIS: Erythrocytosis   HISTORY OF PRESENT ILLNESS: 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  She feels fatigued.  She states that both her mother and brother also had history of elevated Hgb.  Hgb is now 20.1, MCV 99, WBC count 8.2 and platelets 283.  She smokes 2 cigarettes a day.  No hormone replacement or supplementation use. No ETOH or recreational drug use.  No history of thrombotic event.  She states there is a strong family history of stroke on both her mother's and father's side of the family. No known history of liver disease.   She has history of HTN and is currently on Norvasc and Toprol-XL Mammogram is up to date and last done in 07/2019, results negative. Due again in 07/2020.  She states that she is up to date on her colonoscopy and moth recent was negative.  She has chronic lower back pain and states that she has had several spinal fusion surgeries.  No adenopathy or lymphedema noted.  She states that she has had a basal cell carcinoma removed from her right cheek. Her mother and brother also have history of skin cancer. Her mother also had history of breast and uterine cancer.  Patient states that she went through the change of life naturally and has no history of miscarriage.  No fever, chills, n/v, cough, rash, dizziness, SOB, chest pain, palpitations, abdominal pain or changes in bowel or bladder habits.   She takes Miralax daily to help prevent constipation. She occasional abdominal bloating.  She has had numbness and tingling in her hands and feet for years.  No swelling or tenderness in her extremities. Pedal  pulses are 2+.  No falls or syncope to report.  She has maintained a good appetite but admits that she needs to better hydrate throughout the day.   ROS: All other 10 point review of systems is negative.   PAST MEDICAL HISTORY:   No past medical history on file.  ALLERGIES: Not on File    MEDICATIONS:  No current outpatient medications on file prior to visit.   No current facility-administered medications on file prior to visit.     PAST SURGICAL HISTORY No past surgical history on file.  FAMILY HISTORY: No family history on file.  SOCIAL HISTORY:  has no history on file for tobacco use, alcohol use, and drug use.  PERFORMANCE STATUS: The patient's performance status is 1 - Symptomatic but completely ambulatory  PHYSICAL EXAM: Most Recent Vital Signs: Blood pressure 134/73, pulse 74, temperature 98.2 F (36.8 C), temperature source Oral, resp. rate 16, SpO2 99 %. BP 134/73 (BP Location: Right Arm, Patient Position: Sitting)   Pulse 74   Temp 98.2 F (36.8 C) (Oral)   Resp 16   SpO2 99%   General Appearance:    Alert, cooperative, no distress, appears stated age  Head:    Normocephalic, without obvious abnormality, atraumatic  Eyes:    PERRL, conjunctiva/corneas clear, EOM's intact, fundi    benign, both eyes        Throat:   Lips, mucosa, and tongue normal; teeth and gums normal  Neck:   Supple, symmetrical, trachea midline, no adenopathy;    thyroid:  no enlargement/tenderness/nodules; no carotid   bruit or JVD  Back:     Symmetric, no curvature, ROM normal, no CVA tenderness  Lungs:     Clear to auscultation bilaterally, respirations unlabored  Chest Wall:    No tenderness or deformity   Heart:    Regular rate and rhythm, S1 and S2 normal, no murmur, rub   or gallop     Abdomen:     Soft, non-tender, bowel sounds active all four quadrants,    no masses, no organomegaly        Extremities:   Extremities normal, atraumatic, no cyanosis or edema  Pulses:    2+ and symmetric all extremities  Skin:   Skin color, texture, turgor normal, no rashes or lesions  Lymph nodes:   Cervical, supraclavicular, and axillary nodes normal  Neurologic:   CNII-XII intact, normal strength, sensation and reflexes    throughout    LABORATORY DATA:  Results for orders placed or performed in visit on 07/23/20 (from the past 48 hour(s))  CMP (Cancer Center only)     Status: Abnormal   Collection Time: 07/23/20 11:30 AM  Result Value Ref Range   Sodium 139 135 - 145 mmol/L   Potassium 4.4 3.5 - 5.1 mmol/L   Chloride 100 98 - 111 mmol/L   CO2 33 (H) 22 - 32 mmol/L   Glucose, Bld 133 (H) 70 - 99 mg/dL    Comment: Glucose reference range applies only to samples taken after fasting for at least 8 hours.   BUN 11 8 - 23 mg/dL   Creatinine 3.71 6.96 - 1.00 mg/dL   Calcium 78.9 8.9 - 38.1 mg/dL   Total Protein 8.1 6.5 - 8.1 g/dL   Albumin 4.8 3.5 - 5.0 g/dL   AST 32 15 - 41 U/L   ALT 35 0 - 44 U/L   Alkaline Phosphatase 72 38 - 126 U/L   Total Bilirubin 0.8 0.3 - 1.2 mg/dL   GFR, Estimated >01 >75 mL/min    Comment: (NOTE) Calculated using the CKD-EPI Creatinine Equation (2021)    Anion gap 6 5 - 15    Comment: Performed at [facility], [address], [place], [place] [address]  CBC with Differential (Cancer Center Only)     Status: Abnormal   Collection Time: 07/23/20 11:30 AM  Result Value Ref Range   WBC Count 8.2 4.0 - 10.5 K/uL   RBC 6.04 (H) 3.87 - 5.11 MIL/uL   Hemoglobin 20.1 (H) 12.0 - 15.0 g/dL   HCT 52.7 (H) 78.2 - 42.3 %   MCV 99.5 80.0 - 100.0 fL   MCH 33.3 26.0 - 34.0 pg   MCHC 33.4 30.0 - 36.0 g/dL   RDW 53.6 14.4 - 31.5 %   Platelet Count 283 150 - 400 K/uL   nRBC 0.0 0.0 - 0.2 %   Neutrophils Relative % 65 %   Neutro Abs 5.4 1.7 - 7.7 K/uL   Lymphocytes Relative 24 %   Lymphs Abs 2.0 0.7 - 4.0 K/uL   Monocytes Relative 8 %   Monocytes Absolute 0.7 0.1 - 1.0 K/uL   Eosinophils Relative 1 %   Eosinophils  Absolute 0.1 0.0 - 0.5 K/uL   Basophils Relative 1 %   Basophils Absolute 0.0 0.0 - 0.1 K/uL   Immature Granulocytes 1 %   Abs Immature Granulocytes 0.04 0.00 - 0.07 K/uL  Comment: Performed at [facility] at [facility], [address], [place], [place] [address]  Save Smear [facility])     Status: None   Collection Time: 07/23/20 11:30 AM  Result Value Ref Range   Smear Review SMEAR STAINED AND AVAILABLE FOR REVIEW     Comment: Performed at [facility] at [facility], [address], [place], [place] [address]  Reticulocytes     Status: Abnormal   Collection Time: 07/23/20 11:31 AM  Result Value Ref Range   Retic Ct Pct 1.2 0.4 - 3.1 %   RBC. 6.07 (H) 3.87 - 5.11 MIL/uL   Retic Count, Absolute 72.8 19.0 - 186.0 K/uL   Immature Retic Fract 5.9 2.3 - 15.9 %    Comment: Performed at [facility] at [facility], [address], [place], [place] [address]      RADIOGRAPHY: No results found.     PATHOLOGY: None  ASSESSMENT/PLAN: 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  JAK-2 and iron studies are pending to determine polycythemia vs hemochromatosis.   Hgb is now 20.1 and patient is symptomatic with fatigue, HTN and numbness and tingling in the hands and feet.  We will proceed with phlebotomy today and weekly for 4 weeks with follow-up in 1 month.   All questions were answered and she is in agreement with the plan. The patient knows to call the clinic with any problems, questions or concerns. We can certainly see the patient much sooner if necessary.  The patient was discussed with Dr. Myna Hidalgo and he is in agreement with the aforementioned.   Emeline Gins, NP

## 2020-07-23 NOTE — Progress Notes (Signed)
Abigail Mullins presents today for phlebotomy per MD orders. Phlebotomy procedure started at 1405 and ended at 1420 at pt.s request (she had to go to the bathroom). 320 cc removed via 20 G needle at [facility] site. Patient tolerated procedure well. IV needle removed intact.

## 2020-07-24 LAB — ERYTHROPOIETIN: Erythropoietin: 3.6 m[IU]/mL (ref 2.6–18.5)

## 2020-07-26 ENCOUNTER — Telehealth: Payer: Self-pay

## 2020-07-26 ENCOUNTER — Telehealth: Payer: Self-pay | Admitting: *Deleted

## 2020-07-26 NOTE — Telephone Encounter (Signed)
Called pt per 07/23/20 los, pt req to have phlebot 5/6 and then 5/16,5/23  as she is having eye surg 5/12 and has to go back 5/13 for f/u.  Is this sch is ok then no need to call pt.  If pt needs phlebot 5/11 prior to surg please advise  Thurston Hole

## 2020-07-26 NOTE — Telephone Encounter (Signed)
Returned call to pt, per [name],NP cataract surgery should be ok. We are still waiting on lab results to determine cause of elevated hgb.  Pt has a pre-op appt tomorrow, discussed with pt our scheduler will call pt with any follow up appts. No further concerns at this time.

## 2020-07-26 NOTE — Telephone Encounter (Signed)
Pt called with concerns regarding upcoming cataract surgery on 5/12 and 5/26. Pt asking for verbal clearance for this procedure as she recently had Phlebotomy. Will review with provider and call pt with update.

## 2020-07-27 DIAGNOSIS — Z01818 Encounter for other preprocedural examination: Secondary | ICD-10-CM | POA: Diagnosis not present

## 2020-07-27 DIAGNOSIS — H2512 Age-related nuclear cataract, left eye: Secondary | ICD-10-CM | POA: Diagnosis not present

## 2020-07-27 DIAGNOSIS — H2513 Age-related nuclear cataract, bilateral: Secondary | ICD-10-CM | POA: Diagnosis not present

## 2020-07-28 ENCOUNTER — Telehealth: Payer: Self-pay

## 2020-07-28 NOTE — Telephone Encounter (Signed)
5/11 phlebot ok per chart note, pt aware, pt will dble chek on 5/6 when here is she needs to move the 5/16 to 5/18 and 5/23 to 5/25   Abigail Mullins

## 2020-07-30 ENCOUNTER — Other Ambulatory Visit: Payer: Self-pay

## 2020-07-30 ENCOUNTER — Inpatient Hospital Stay: Payer: Medicare Other | Attending: Hematology & Oncology

## 2020-07-30 VITALS — BP 114/63 | HR 64 | Temp 98.3°F | Resp 17

## 2020-07-30 DIAGNOSIS — D751 Secondary polycythemia: Secondary | ICD-10-CM | POA: Diagnosis present

## 2020-07-30 DIAGNOSIS — R200 Anesthesia of skin: Secondary | ICD-10-CM | POA: Insufficient documentation

## 2020-07-30 DIAGNOSIS — R202 Paresthesia of skin: Secondary | ICD-10-CM | POA: Diagnosis not present

## 2020-07-30 DIAGNOSIS — F17210 Nicotine dependence, cigarettes, uncomplicated: Secondary | ICD-10-CM | POA: Insufficient documentation

## 2020-07-30 DIAGNOSIS — Z79899 Other long term (current) drug therapy: Secondary | ICD-10-CM | POA: Diagnosis not present

## 2020-07-30 NOTE — Patient Instructions (Signed)
Therapeutic Phlebotomy Therapeutic phlebotomy is the planned removal of blood from a person's body for the purpose of treating a medical condition. The procedure is similar to donating blood. Usually, about a pint (470 mL, or 0.47 L) of blood is removed. The average adult has 9-12 pints (4.3-5.7 L) of blood in the body. Therapeutic phlebotomy may be used to treat the following medical conditions:  Hemochromatosis. This is a condition in which the blood contains too much iron.  Polycythemia vera. This is a condition in which the blood contains too many red blood cells.  Porphyria cutanea tarda. This is a disease in which an important part of hemoglobin is not made properly. It results in the buildup of abnormal amounts of porphyrins in the body.  Sickle cell disease. This is a condition in which the red blood cells form an abnormal crescent shape rather than a round shape. Tell a health care provider about:  Any allergies you have.  All medicines you are taking, including vitamins, herbs, eye drops, creams, and over-the-counter medicines.  Any problems you or family members have had with anesthetic medicines.  Any blood disorders you have.  Any surgeries you have had.  Any medical conditions you have.  Whether you are pregnant or may be pregnant. What are the risks? Generally, this is a safe procedure. However, problems may occur, including:  Nausea or light-headedness.  Low blood pressure (hypotension).  Soreness, bleeding, swelling, or bruising at the needle insertion site.  Infection. What happens before the procedure?  Follow instructions from your health care provider about eating or drinking restrictions.  Ask your health care provider about: ? Changing or stopping your regular medicines. This is especially important if you are taking diabetes medicines or blood thinners (anticoagulants). ? Taking medicines such as aspirin and ibuprofen. These medicines can thin your  blood. Do not take these medicines unless your health care provider tells you to take them. ? Taking over-the-counter medicines, vitamins, herbs, and supplements.  Wear clothing with sleeves that can be raised above the elbow.  Plan to have someone take you home from the hospital or clinic.  You may have a blood sample taken.  Your blood pressure, pulse rate, and breathing rate will be measured. What happens during the procedure?  To lower your risk of infection: ? Your health care team will wash or sanitize their hands. ? Your skin will be cleaned with an antiseptic.  You may be given a medicine to numb the area (local anesthetic).  A tourniquet will be placed on your arm.  A needle will be inserted into one of your veins.  Tubing and a collection bag will be attached to that needle.  Blood will flow through the needle and tubing into the collection bag.  The collection bag will be placed lower than your arm to allow gravity to help the flow of blood into the bag.  You may be asked to open and close your hand slowly and continually during the entire collection.  After the specified amount of blood has been removed from your body, the collection bag and tubing will be clamped.  The needle will be removed from your vein.  Pressure will be held on the site of the needle insertion to stop the bleeding.  A bandage (dressing) will be placed over the needle insertion site. The procedure may vary among health care providers and hospitals.   What happens after the procedure?  Your blood pressure, pulse rate, and breathing rate will   be measured after the procedure.  You will be encouraged to drink fluids.  Your recovery will be assessed and monitored.  You can return to your normal activities as told by your health care provider. Summary  Therapeutic phlebotomy is the planned removal of blood from a person's body for the purpose of treating a medical condition.  Therapeutic  phlebotomy may be used to treat hemochromatosis, polycythemia vera, porphyria cutanea tarda, or sickle cell disease.  In the procedure, a needle is inserted and about a pint (470 mL, or 0.47 L) of blood is removed. The average adult has 9-12 pints (4.3-5.7 L) of blood in the body.  This is generally a safe procedure, but it can sometimes cause problems such as nausea, light-headedness, or low blood pressure (hypotension). This information is not intended to replace advice given to you by your health care provider. Make sure you discuss any questions you have with your health care provider. Document Revised: 03/29/2017 Document Reviewed: 03/29/2017 Elsevier Patient Education  2021 [employer] Inc.  

## 2020-07-30 NOTE — Progress Notes (Signed)
1 unit phlebotomy performed over 10 minutes using a 18 gauge IV to the right AC. Patient tolerated well. Nourishment provided.

## 2020-08-03 DIAGNOSIS — Z9889 Other specified postprocedural states: Secondary | ICD-10-CM | POA: Diagnosis not present

## 2020-08-03 DIAGNOSIS — G894 Chronic pain syndrome: Secondary | ICD-10-CM | POA: Diagnosis not present

## 2020-08-03 DIAGNOSIS — R030 Elevated blood-pressure reading, without diagnosis of hypertension: Secondary | ICD-10-CM | POA: Diagnosis not present

## 2020-08-03 LAB — JAK 2 V617F (GENPATH)

## 2020-08-04 ENCOUNTER — Inpatient Hospital Stay: Payer: Medicare Other

## 2020-08-04 ENCOUNTER — Other Ambulatory Visit: Payer: Self-pay | Admitting: Family

## 2020-08-04 ENCOUNTER — Telehealth: Payer: Self-pay

## 2020-08-04 ENCOUNTER — Other Ambulatory Visit: Payer: Self-pay

## 2020-08-04 DIAGNOSIS — R202 Paresthesia of skin: Secondary | ICD-10-CM | POA: Diagnosis not present

## 2020-08-04 DIAGNOSIS — F17210 Nicotine dependence, cigarettes, uncomplicated: Secondary | ICD-10-CM | POA: Diagnosis not present

## 2020-08-04 DIAGNOSIS — R200 Anesthesia of skin: Secondary | ICD-10-CM | POA: Diagnosis not present

## 2020-08-04 DIAGNOSIS — D751 Secondary polycythemia: Secondary | ICD-10-CM | POA: Diagnosis not present

## 2020-08-04 DIAGNOSIS — Z79899 Other long term (current) drug therapy: Secondary | ICD-10-CM | POA: Diagnosis not present

## 2020-08-04 NOTE — Telephone Encounter (Signed)
Called pt.to notify her of her JAK 2 results. Left message on her voicemail to call us back.

## 2020-08-04 NOTE — Patient Instructions (Signed)
Therapeutic Phlebotomy Therapeutic phlebotomy is the planned removal of blood from a person's body for the purpose of treating a medical condition. The procedure is similar to donating blood. Usually, about a pint (470 mL, or 0.47 L) of blood is removed. The average adult has 9-12 pints (4.3-5.7 L) of blood in the body. Therapeutic phlebotomy may be used to treat the following medical conditions:  Hemochromatosis. This is a condition in which the blood contains too much iron.  Polycythemia vera. This is a condition in which the blood contains too many red blood cells.  Porphyria cutanea tarda. This is a disease in which an important part of hemoglobin is not made properly. It results in the buildup of abnormal amounts of porphyrins in the body.  Sickle cell disease. This is a condition in which the red blood cells form an abnormal crescent shape rather than a round shape. Tell a health care provider about:  Any allergies you have.  All medicines you are taking, including vitamins, herbs, eye drops, creams, and over-the-counter medicines.  Any problems you or family members have had with anesthetic medicines.  Any blood disorders you have.  Any surgeries you have had.  Any medical conditions you have.  Whether you are pregnant or may be pregnant. What are the risks? Generally, this is a safe procedure. However, problems may occur, including:  Nausea or light-headedness.  Low blood pressure (hypotension).  Soreness, bleeding, swelling, or bruising at the needle insertion site.  Infection. What happens before the procedure?  Follow instructions from your health care provider about eating or drinking restrictions.  Ask your health care provider about: ? Changing or stopping your regular medicines. This is especially important if you are taking diabetes medicines or blood thinners (anticoagulants). ? Taking medicines such as aspirin and ibuprofen. These medicines can thin your  blood. Do not take these medicines unless your health care provider tells you to take them. ? Taking over-the-counter medicines, vitamins, herbs, and supplements.  Wear clothing with sleeves that can be raised above the elbow.  Plan to have someone take you home from the hospital or clinic.  You may have a blood sample taken.  Your blood pressure, pulse rate, and breathing rate will be measured. What happens during the procedure?  To lower your risk of infection: ? Your health care team will wash or sanitize their hands. ? Your skin will be cleaned with an antiseptic.  You may be given a medicine to numb the area (local anesthetic).  A tourniquet will be placed on your arm.  A needle will be inserted into one of your veins.  Tubing and a collection bag will be attached to that needle.  Blood will flow through the needle and tubing into the collection bag.  The collection bag will be placed lower than your arm to allow gravity to help the flow of blood into the bag.  You may be asked to open and close your hand slowly and continually during the entire collection.  After the specified amount of blood has been removed from your body, the collection bag and tubing will be clamped.  The needle will be removed from your vein.  Pressure will be held on the site of the needle insertion to stop the bleeding.  A bandage (dressing) will be placed over the needle insertion site. The procedure may vary among health care providers and hospitals.   What happens after the procedure?  Your blood pressure, pulse rate, and breathing rate will   be measured after the procedure.  You will be encouraged to drink fluids.  Your recovery will be assessed and monitored.  You can return to your normal activities as told by your health care provider. Summary  Therapeutic phlebotomy is the planned removal of blood from a person's body for the purpose of treating a medical condition.  Therapeutic  phlebotomy may be used to treat hemochromatosis, polycythemia vera, porphyria cutanea tarda, or sickle cell disease.  In the procedure, a needle is inserted and about a pint (470 mL, or 0.47 L) of blood is removed. The average adult has 9-12 pints (4.3-5.7 L) of blood in the body.  This is generally a safe procedure, but it can sometimes cause problems such as nausea, light-headedness, or low blood pressure (hypotension). This information is not intended to replace advice given to you by your health care provider. Make sure you discuss any questions you have with your health care provider. Document Revised: 03/29/2017 Document Reviewed: 03/29/2017 Elsevier Patient Education  2021 [employer] Inc.  

## 2020-08-04 NOTE — Telephone Encounter (Signed)
-----   Message from Verdie Mosher, NP sent at 08/04/2020 12:57 PM EDT -----  ----- Message ----- From: Leory Plowman, Lab In [place] Sent: 07/23/2020  11:56 AM EDT To: Verdie Mosher, NP

## 2020-08-04 NOTE — Progress Notes (Signed)
Lourena Simmonds presents today for phlebotomy per MD orders. Phlebotomy procedure started at 1440 and ended at 1450. 514 grams removed via 18 gauge needle to Right AC. Patient observed for 30 minutes after procedure without any incident. Patient tolerated procedure well. IV needle removed intact.   Pt declined to stay for post procedure observation period. Pt stated she has tolerated proccedure multiple times prior without difficulty. Pt aware to call clinic with any questions or concerns. Pt verbalized understanding and had no further questions.

## 2020-08-05 DIAGNOSIS — H25812 Combined forms of age-related cataract, left eye: Secondary | ICD-10-CM | POA: Diagnosis not present

## 2020-08-05 DIAGNOSIS — H2513 Age-related nuclear cataract, bilateral: Secondary | ICD-10-CM | POA: Diagnosis not present

## 2020-08-06 ENCOUNTER — Telehealth: Payer: Self-pay | Admitting: *Deleted

## 2020-08-06 NOTE — Telephone Encounter (Signed)
JAK 2 was negative which is awesome! We will check a hemochromatosis DNA on her today when she comes in for phlebotomy. This will tell us if she has a genetic reason to want to hold on to too much iron.

## 2020-08-09 ENCOUNTER — Other Ambulatory Visit: Payer: Self-pay

## 2020-08-09 ENCOUNTER — Inpatient Hospital Stay: Payer: Medicare Other

## 2020-08-09 DIAGNOSIS — D751 Secondary polycythemia: Secondary | ICD-10-CM | POA: Diagnosis not present

## 2020-08-09 NOTE — Patient Instructions (Signed)

## 2020-08-12 LAB — HEMOCHROMATOSIS DNA-PCR(C282Y,H63D)

## 2020-08-16 ENCOUNTER — Inpatient Hospital Stay: Payer: Medicare Other

## 2020-08-16 ENCOUNTER — Telehealth: Payer: Self-pay | Admitting: Family

## 2020-08-16 NOTE — Telephone Encounter (Signed)
I was able to go over her Hemochromatosis and JAK-2 results which were both negative. I discussed her elevated counts with Dr. Myna Hidalgo and it is felt that she does have polycythemia. We will continue to watch. She has phlebotomy tomorrow and follow-up next week. She verbalized understanding and was appreciative of call.

## 2020-08-17 ENCOUNTER — Other Ambulatory Visit: Payer: Self-pay

## 2020-08-17 ENCOUNTER — Inpatient Hospital Stay: Payer: Medicare Other

## 2020-08-17 VITALS — BP 115/60 | HR 74 | Temp 98.9°F | Resp 18

## 2020-08-17 DIAGNOSIS — D751 Secondary polycythemia: Secondary | ICD-10-CM

## 2020-08-17 NOTE — Patient Instructions (Signed)

## 2020-08-17 NOTE — Progress Notes (Signed)
Abigail Mullins presents today for phlebotomy per MD orders. Phlebotomy procedure started at 1320 and ended at 49.Marland Kitchen 523 cc removed via 18 G needle at R [facility] site. Patient tolerated procedure well. IV needle removed intact.

## 2020-08-19 DIAGNOSIS — H2513 Age-related nuclear cataract, bilateral: Secondary | ICD-10-CM | POA: Diagnosis not present

## 2020-08-19 DIAGNOSIS — H25811 Combined forms of age-related cataract, right eye: Secondary | ICD-10-CM | POA: Diagnosis not present

## 2020-08-21 ENCOUNTER — Other Ambulatory Visit: Payer: Self-pay | Admitting: Family

## 2020-08-21 DIAGNOSIS — D751 Secondary polycythemia: Secondary | ICD-10-CM

## 2020-08-24 ENCOUNTER — Inpatient Hospital Stay: Payer: Medicare Other

## 2020-08-24 ENCOUNTER — Inpatient Hospital Stay (HOSPITAL_BASED_OUTPATIENT_CLINIC_OR_DEPARTMENT_OTHER): Payer: Medicare Other | Admitting: Family

## 2020-08-24 ENCOUNTER — Other Ambulatory Visit: Payer: Self-pay

## 2020-08-24 VITALS — BP 124/54 | HR 71 | Temp 98.0°F | Resp 18

## 2020-08-24 DIAGNOSIS — F17210 Nicotine dependence, cigarettes, uncomplicated: Secondary | ICD-10-CM | POA: Diagnosis not present

## 2020-08-24 DIAGNOSIS — R200 Anesthesia of skin: Secondary | ICD-10-CM | POA: Diagnosis not present

## 2020-08-24 DIAGNOSIS — R202 Paresthesia of skin: Secondary | ICD-10-CM | POA: Diagnosis not present

## 2020-08-24 DIAGNOSIS — Z79899 Other long term (current) drug therapy: Secondary | ICD-10-CM | POA: Diagnosis not present

## 2020-08-24 DIAGNOSIS — D751 Secondary polycythemia: Secondary | ICD-10-CM

## 2020-08-24 DIAGNOSIS — D500 Iron deficiency anemia secondary to blood loss (chronic): Secondary | ICD-10-CM | POA: Diagnosis not present

## 2020-08-24 LAB — CBC WITH DIFFERENTIAL (CANCER CENTER ONLY)
Abs Immature Granulocytes: 0.08 10*3/uL — ABNORMAL HIGH (ref 0.00–0.07)
Basophils Absolute: 0.1 10*3/uL (ref 0.0–0.1)
Basophils Relative: 1 %
Eosinophils Absolute: 0.1 10*3/uL (ref 0.0–0.5)
Eosinophils Relative: 1 %
HCT: 36 % (ref 36.0–46.0)
Hemoglobin: 11.5 g/dL — ABNORMAL LOW (ref 12.0–15.0)
Immature Granulocytes: 1 %
Lymphocytes Relative: 26 %
Lymphs Abs: 2.1 10*3/uL (ref 0.7–4.0)
MCH: 32.5 pg (ref 26.0–34.0)
MCHC: 31.9 g/dL (ref 30.0–36.0)
MCV: 101.7 fL — ABNORMAL HIGH (ref 80.0–100.0)
Monocytes Absolute: 0.9 10*3/uL (ref 0.1–1.0)
Monocytes Relative: 11 %
Neutro Abs: 4.9 10*3/uL (ref 1.7–7.7)
Neutrophils Relative %: 60 %
Platelet Count: 301 10*3/uL (ref 150–400)
RBC: 3.54 MIL/uL — ABNORMAL LOW (ref 3.87–5.11)
RDW: 13.1 % (ref 11.5–15.5)
WBC Count: 8 10*3/uL (ref 4.0–10.5)
nRBC: 0 % (ref 0.0–0.2)

## 2020-08-24 LAB — CMP (CANCER CENTER ONLY)
ALT: 16 U/L (ref 0–44)
AST: 22 U/L (ref 15–41)
Albumin: 4.2 g/dL (ref 3.5–5.0)
Alkaline Phosphatase: 66 U/L (ref 38–126)
Anion gap: 8 (ref 5–15)
BUN: 13 mg/dL (ref 8–23)
CO2: 29 mmol/L (ref 22–32)
Calcium: 9.7 mg/dL (ref 8.9–10.3)
Chloride: 102 mmol/L (ref 98–111)
Creatinine: 0.69 mg/dL (ref 0.44–1.00)
GFR, Estimated: >60 mL/min (ref >60)
Glucose, Bld: 118 mg/dL — ABNORMAL HIGH (ref 70–99)
Potassium: 5.1 mmol/L (ref 3.5–5.1)
Sodium: 139 mmol/L (ref 135–145)
Total Bilirubin: 0.4 mg/dL (ref 0.3–1.2)
Total Protein: 6.6 g/dL (ref 6.5–8.1)

## 2020-08-24 LAB — RETICULOCYTES
Immature Retic Fract: 20.4 % — ABNORMAL HIGH (ref 2.3–15.9)
RBC.: 3.51 MIL/uL — ABNORMAL LOW (ref 3.87–5.11)
Retic Count, Absolute: 152.7 10*3/uL (ref 19.0–186.0)
Retic Ct Pct: 4.4 % — ABNORMAL HIGH (ref 0.4–3.1)

## 2020-08-24 LAB — LACTATE DEHYDROGENASE: LDH: 112 U/L (ref 98–192)

## 2020-08-24 NOTE — Progress Notes (Signed)
Hematology and Oncology Follow Up Visit  Abigail Mullins [id number] September 17, 1950 70 y.o. 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 She has had a nice response to phlebotomy. Hgb is now 11.5 and Hct 36%. She notes that her energy has improved.  She notes that when she lays down she can hear her heart beat in her ears. This may be related to iron deficiency as a result of her phlebotomies. Iron studies are pending.  No blood loss noted. No bruising or petechiae.  She does not feel that she has sleep apnea but will consider having a sleep study.  She is smoking 2 cigarettes a day.  No fever, chills, n/v, cough, rash, dizziness, SOB, chest pain, palpitations, abdominal pain or changes in bowel or bladder habits.  Intermittent numbness and tingling in her upper a lower extremities is described as stable at baseline.  No falls or syncope to report.  No swelling in her extremities.  She has maintained a good appetite and is staying well hydrated. Her weight is stable at 153 lbs.   ECOG Performance Status: 1 - Symptomatic but completely ambulatory  Medications:  Allergies as of 08/24/2020   Not on File     Medication List       Accurate as of Aug 24, 2020  2:31 PM. If you have any questions, ask your nurse or doctor.        amLODipine 5 MG tablet Commonly known as: NORVASC Take 5 mg by mouth daily.   fentaNYL 50 MCG/HR Commonly known as: DURAGESIC Place 1 patch onto the skin 3 times/day as needed-between meals & bedtime.   fexofenadine 60 MG tablet Commonly known as: ALLEGRA Take 60 mg by mouth as needed.   ibuprofen 800 MG tablet Commonly known as: ADVIL Take 800 mg by mouth as needed.   metoprolol succinate 25 MG 24 hr tablet Commonly known as:  TOPROL-XL Take 25 mg by mouth daily.   MiraLax 17 g packet Generic drug: polyethylene glycol Mix 1 capful in 8 ounces of fluid   Percocet 5-325 MG tablet Generic drug: oxyCODONE-acetaminophen Take 1 tablet by mouth as needed.   SYSTANE ULTRA PF OP Apply to eye as needed.       Allergies: Not on File  Past Medical History, Surgical history, Social history, and Family History were reviewed and updated.  Review of Systems: All other 10 point review of systems is negative.   Physical Exam:  vitals were not taken for this visit.   Wt Readings from Last 3 Encounters:  No data found for Wt    Ocular: Sclerae unicteric, pupils equal, round and reactive to light Ear-nose-throat: Oropharynx clear, dentition fair Lymphatic: No cervical or supraclavicular adenopathy Lungs no rales or rhonchi, good excursion bilaterally Heart regular rate and rhythm, no murmur appreciated Abd soft, nontender, positive bowel sounds MSK no focal spinal tenderness, no joint edema Neuro: non-focal, well-oriented, appropriate affect Breasts:   Lab Results  Component Value Date   WBC 8.0 08/24/2020   HGB 11.5 (L) 08/24/2020   HCT 36.0 08/24/2020   MCV 101.7 (H) 08/24/2020   PLT 301 08/24/2020   Lab Results  Component Value Date   FERRITIN 201 07/23/2020   IRON 218 (H) 07/23/2020   TIBC 421 07/23/2020   UIBC  203 07/23/2020   IRONPCTSAT 52 (H) 07/23/2020   Lab Results  Component Value Date   RETICCTPCT 4.4 (H) 08/24/2020   RBC 3.54 (L) 08/24/2020   RBC 3.51 (L) 08/24/2020   No results found for: KPAFRELGTCHN, LAMBDASER, KAPLAMBRATIO No results found for: IGGSERUM, IGA, IGMSERUM No results found for: Dorene Ar, A1GS, Emmit Alexanders, SPEI   Chemistry      Component Value Date/Time   NA 139 08/24/2020 1312   K 5.1 08/24/2020 1312   CL 102 08/24/2020 1312   CO2 29 08/24/2020 1312   BUN 13 08/24/2020 1312   CREATININE 0.69 08/24/2020 1312       Component Value Date/Time   CALCIUM 9.7 08/24/2020 1312   ALKPHOS 66 08/24/2020 1312   AST 22 08/24/2020 1312   ALT 16 08/24/2020 1312   BILITOT 0.4 08/24/2020 1312       Impression and Plan: Abigail Mullins is a very pleasant 70 yo caucasian female with polycythemia, secondary.  JAK 2 and hemochromatosis DNA were both negative.  She has responded nicely to phlebotomy. None needed this visit, Hct 36%.  Lab and phlebotomy if needed in 1 month and follow-up in 2 months.  She can contact our office with any questions or concerns.   Emeline Gins, NP 5/31/20222:31 PM

## 2020-08-25 LAB — IRON AND TIBC
Iron: 64 ug/dL (ref 41–142)
Saturation Ratios: 15 % — ABNORMAL LOW (ref 21–57)
TIBC: 416 ug/dL (ref 236–444)
UIBC: 352 ug/dL (ref 120–384)

## 2020-08-25 LAB — FERRITIN: Ferritin: 35 ng/mL (ref 11–307)

## 2020-08-26 ENCOUNTER — Telehealth: Payer: Self-pay | Admitting: *Deleted

## 2020-08-26 NOTE — Telephone Encounter (Signed)
Per 08/24/20 los - called and gave patient upcoming appointments

## 2020-09-23 ENCOUNTER — Other Ambulatory Visit: Payer: Self-pay

## 2020-09-23 ENCOUNTER — Other Ambulatory Visit: Payer: Medicare Other

## 2020-09-23 ENCOUNTER — Inpatient Hospital Stay: Payer: Medicare Other

## 2020-09-23 ENCOUNTER — Inpatient Hospital Stay: Payer: Medicare Other | Attending: Hematology & Oncology

## 2020-09-23 DIAGNOSIS — D751 Secondary polycythemia: Secondary | ICD-10-CM | POA: Insufficient documentation

## 2020-09-23 DIAGNOSIS — D500 Iron deficiency anemia secondary to blood loss (chronic): Secondary | ICD-10-CM

## 2020-09-23 LAB — CBC WITH DIFFERENTIAL (CANCER CENTER ONLY)
Abs Immature Granulocytes: 0.03 10*3/uL (ref 0.00–0.07)
Basophils Absolute: 0.1 10*3/uL (ref 0.0–0.1)
Basophils Relative: 1 %
Eosinophils Absolute: 0.1 10*3/uL (ref 0.0–0.5)
Eosinophils Relative: 1 %
HCT: 44.1 % (ref 36.0–46.0)
Hemoglobin: 13.9 g/dL (ref 12.0–15.0)
Immature Granulocytes: 0 %
Lymphocytes Relative: 19 %
Lymphs Abs: 1.9 10*3/uL (ref 0.7–4.0)
MCH: 31.2 pg (ref 26.0–34.0)
MCHC: 31.5 g/dL (ref 30.0–36.0)
MCV: 99.1 fL (ref 80.0–100.0)
Monocytes Absolute: 1 10*3/uL (ref 0.1–1.0)
Monocytes Relative: 10 %
Neutro Abs: 7 10*3/uL (ref 1.7–7.7)
Neutrophils Relative %: 69 %
Platelet Count: 286 10*3/uL (ref 150–400)
RBC: 4.45 MIL/uL (ref 3.87–5.11)
RDW: 11.9 % (ref 11.5–15.5)
WBC Count: 10 10*3/uL (ref 4.0–10.5)
nRBC: 0 % (ref 0.0–0.2)

## 2020-09-23 LAB — CMP (CANCER CENTER ONLY)
ALT: 16 U/L (ref 0–44)
AST: 23 U/L (ref 15–41)
Albumin: 4.3 g/dL (ref 3.5–5.0)
Alkaline Phosphatase: 72 U/L (ref 38–126)
Anion gap: 7 (ref 5–15)
BUN: 13 mg/dL (ref 8–23)
CO2: 29 mmol/L (ref 22–32)
Calcium: 9.5 mg/dL (ref 8.9–10.3)
Chloride: 100 mmol/L (ref 98–111)
Creatinine: 0.64 mg/dL (ref 0.44–1.00)
GFR, Estimated: >60 mL/min (ref >60)
Glucose, Bld: 130 mg/dL — ABNORMAL HIGH (ref 70–99)
Potassium: 4 mmol/L (ref 3.5–5.1)
Sodium: 136 mmol/L (ref 135–145)
Total Bilirubin: 0.4 mg/dL (ref 0.3–1.2)
Total Protein: 6.9 g/dL (ref 6.5–8.1)

## 2020-09-24 LAB — IRON AND TIBC
Iron: 54 ug/dL (ref 41–142)
Saturation Ratios: 12 % — ABNORMAL LOW (ref 21–57)
TIBC: 438 ug/dL (ref 236–444)
UIBC: 383 ug/dL (ref 120–384)

## 2020-09-24 LAB — FERRITIN: Ferritin: 14 ng/mL (ref 11–307)

## 2020-10-06 DIAGNOSIS — H52223 Regular astigmatism, bilateral: Secondary | ICD-10-CM | POA: Diagnosis not present

## 2020-10-22 ENCOUNTER — Encounter: Payer: Self-pay | Admitting: Family

## 2020-10-22 ENCOUNTER — Other Ambulatory Visit: Payer: Self-pay

## 2020-10-22 ENCOUNTER — Inpatient Hospital Stay: Payer: Medicare Other

## 2020-10-22 ENCOUNTER — Inpatient Hospital Stay: Payer: Medicare Other | Admitting: Family

## 2020-10-22 ENCOUNTER — Inpatient Hospital Stay: Payer: Medicare Other | Attending: Hematology & Oncology

## 2020-10-22 VITALS — BP 156/77 | HR 77 | Temp 98.2°F | Resp 18 | Ht 67.0 in | Wt 151.8 lb

## 2020-10-22 DIAGNOSIS — G629 Polyneuropathy, unspecified: Secondary | ICD-10-CM | POA: Insufficient documentation

## 2020-10-22 DIAGNOSIS — Z79899 Other long term (current) drug therapy: Secondary | ICD-10-CM | POA: Insufficient documentation

## 2020-10-22 DIAGNOSIS — R5383 Other fatigue: Secondary | ICD-10-CM | POA: Diagnosis not present

## 2020-10-22 DIAGNOSIS — D500 Iron deficiency anemia secondary to blood loss (chronic): Secondary | ICD-10-CM | POA: Diagnosis not present

## 2020-10-22 DIAGNOSIS — K5900 Constipation, unspecified: Secondary | ICD-10-CM | POA: Diagnosis not present

## 2020-10-22 DIAGNOSIS — G8929 Other chronic pain: Secondary | ICD-10-CM | POA: Diagnosis not present

## 2020-10-22 DIAGNOSIS — D751 Secondary polycythemia: Secondary | ICD-10-CM

## 2020-10-22 LAB — CMP (CANCER CENTER ONLY)
ALT: 18 U/L (ref 0–44)
AST: 26 U/L (ref 15–41)
Albumin: 4.3 g/dL (ref 3.5–5.0)
Alkaline Phosphatase: 76 U/L (ref 38–126)
Anion gap: 8 (ref 5–15)
BUN: 10 mg/dL (ref 8–23)
CO2: 29 mmol/L (ref 22–32)
Calcium: 9.4 mg/dL (ref 8.9–10.3)
Chloride: 101 mmol/L (ref 98–111)
Creatinine: 0.65 mg/dL (ref 0.44–1.00)
GFR, Estimated: >60 mL/min (ref >60)
Glucose, Bld: 130 mg/dL — ABNORMAL HIGH (ref 70–99)
Potassium: 4 mmol/L (ref 3.5–5.1)
Sodium: 138 mmol/L (ref 135–145)
Total Bilirubin: 0.3 mg/dL (ref 0.3–1.2)
Total Protein: 7.5 g/dL (ref 6.5–8.1)

## 2020-10-22 LAB — CBC WITH DIFFERENTIAL (CANCER CENTER ONLY)
Abs Immature Granulocytes: 0.03 10*3/uL (ref 0.00–0.07)
Basophils Absolute: 0.1 10*3/uL (ref 0.0–0.1)
Basophils Relative: 1 %
Eosinophils Absolute: 0.1 10*3/uL (ref 0.0–0.5)
Eosinophils Relative: 1 %
HCT: 48.1 % — ABNORMAL HIGH (ref 36.0–46.0)
Hemoglobin: 15.2 g/dL — ABNORMAL HIGH (ref 12.0–15.0)
Immature Granulocytes: 0 %
Lymphocytes Relative: 25 %
Lymphs Abs: 2.3 10*3/uL (ref 0.7–4.0)
MCH: 29.7 pg (ref 26.0–34.0)
MCHC: 31.6 g/dL (ref 30.0–36.0)
MCV: 94.1 fL (ref 80.0–100.0)
Monocytes Absolute: 0.8 10*3/uL (ref 0.1–1.0)
Monocytes Relative: 9 %
Neutro Abs: 5.8 10*3/uL (ref 1.7–7.7)
Neutrophils Relative %: 64 %
Platelet Count: 306 10*3/uL (ref 150–400)
RBC: 5.11 MIL/uL (ref 3.87–5.11)
RDW: 11.9 % (ref 11.5–15.5)
WBC Count: 9.1 10*3/uL (ref 4.0–10.5)
nRBC: 0 % (ref 0.0–0.2)

## 2020-10-22 NOTE — Patient Instructions (Signed)

## 2020-10-22 NOTE — Progress Notes (Signed)
Abigail Mullins presents today for phlebotomy per MD orders. Phlebotomy procedure started at 2:53 PM and ended at 3:06 PM.  502 grams removed via 20 gauge needle to right AC.  Patient observed for 10 minutes declined to wait for 30 minutes. Patient tolerated procedure well. Patient understands to call if he has any questions or concerns post discharge.

## 2020-10-22 NOTE — Progress Notes (Signed)
Hematology and Oncology Follow Up Visit  Nazly Digilio [id number] [birth year] 70 y.o. 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 Zakyia Gagan is here today for follow-up. She is symptomatic with fatigue.  Hct is 48.1%.  No fever, chills, n/v, cough, rash, dizziness, SOB, chest pain, palpitations, abdominal pain or changes in bowel or bladder habits.  She takes Mirilax for constipation.  No swelling in her extremities. No falls or syncope.  She has chronic back pain and neuropathy in her hands and feet.  She has maintained a good appetite and is staying well hydrated. Her weight is stable.   ECOG Performance Status: 1 - Symptomatic but completely ambulatory  Medications:  Allergies as of 10/22/2020   No Known Allergies      Medication List        Accurate as of October 22, 2020  2:33 PM. If you have any questions, ask your nurse or doctor.          amLODipine 5 MG tablet Commonly known as: NORVASC Take 5 mg by mouth daily.   fentaNYL 50 MCG/HR Commonly known as: DURAGESIC Place 1 patch onto the skin 3 times/day as needed-between meals & bedtime.   fexofenadine 60 MG tablet Commonly known as: ALLEGRA Take 60 mg by mouth as needed.   ibuprofen 800 MG tablet Commonly known as: ADVIL Take 800 mg by mouth as needed.   metoprolol succinate 25 MG 24 hr tablet Commonly known as: TOPROL-XL Take 25 mg by mouth daily.   oxyCODONE-acetaminophen 5-325 MG tablet Commonly known as: PERCOCET/ROXICET Take 1 tablet by mouth as needed.   polyethylene glycol 17 g packet Commonly known as: MIRALAX / GLYCOLAX Mix 1 capful in 8 ounces of fluid   SYSTANE ULTRA PF OP Apply to eye as needed.        Allergies: No Known Allergies  Past Medical History, Surgical history, Social history, and Family History  were reviewed and updated.  Review of Systems: All other 10 point review of systems is negative.   Physical Exam:  vitals were not taken for this visit.   Wt Readings from Last 3 Encounters:  No data found for Wt    Ocular: Sclerae unicteric, pupils equal, round and reactive to light Ear-nose-throat: Oropharynx clear, dentition fair Lymphatic: No cervical or supraclavicular adenopathy Lungs no rales or rhonchi, good excursion bilaterally Heart regular rate and rhythm, no murmur appreciated Abd soft, nontender, positive bowel sounds MSK no focal spinal tenderness, no joint edema Neuro: non-focal, well-oriented, appropriate affect Breasts: Deferred   Lab Results  Component Value Date   WBC 9.1 10/22/2020   HGB 15.2 (H) 10/22/2020   HCT 48.1 (H) 10/22/2020   MCV 94.1 10/22/2020   PLT 306 10/22/2020   Lab Results  Component Value Date   FERRITIN 14 09/23/2020   IRON 54 09/23/2020   TIBC 438 09/23/2020   UIBC 383 09/23/2020   IRONPCTSAT 12 (L) 09/23/2020   Lab Results  Component Value Date   RETICCTPCT 4.4 (H) 08/24/2020   RBC 5.11 10/22/2020   No results found for: KPAFRELGTCHN, LAMBDASER, KAPLAMBRATIO No results found for: IGGSERUM, IGA, IGMSERUM No results found for: TOTALPROTELP, ALBUMINELP, A1GS, A2GS, BETS, BETA2SER, GAMS, MSPIKE, SPEI   Chemistry      Component Value Date/Time   NA 136 09/23/2020 1309  K 4.0 09/23/2020 1309   CL 100 09/23/2020 1309   CO2 29 09/23/2020 1309   BUN 13 09/23/2020 1309   CREATININE 0.64 09/23/2020 1309      Component Value Date/Time   CALCIUM 9.5 09/23/2020 1309   ALKPHOS 72 09/23/2020 1309   AST 23 09/23/2020 1309   ALT 16 09/23/2020 1309   BILITOT 0.4 09/23/2020 1309       Impression and Plan: [name] is a very pleasant 70 yo caucasian female with polycythemia, secondary. JAK 2 and hemochromatosis DNA were both negative. We will phlebotomize her today and again the week after next.  Lab check monthly with  phlebotomy with follow-up in 3 months.   Emeline Gins, NP 7/29/20222:33 PM

## 2020-10-25 ENCOUNTER — Telehealth: Payer: Self-pay | Admitting: *Deleted

## 2020-10-25 LAB — IRON AND TIBC
Iron: 33 ug/dL — ABNORMAL LOW (ref 41–142)
Saturation Ratios: 7 % — ABNORMAL LOW (ref 21–57)
TIBC: 464 ug/dL — ABNORMAL HIGH (ref 236–444)
UIBC: 431 ug/dL — ABNORMAL HIGH (ref 120–384)

## 2020-10-25 LAB — FERRITIN: Ferritin: 10 ng/mL — ABNORMAL LOW (ref 11–307)

## 2020-10-25 NOTE — Telephone Encounter (Signed)
Per 10/22/20 los - called and gave upcoming appointments - confirmed and mailed calendar 

## 2020-11-02 DIAGNOSIS — Z9889 Other specified postprocedural states: Secondary | ICD-10-CM | POA: Diagnosis not present

## 2020-11-02 DIAGNOSIS — G894 Chronic pain syndrome: Secondary | ICD-10-CM | POA: Diagnosis not present

## 2020-11-05 ENCOUNTER — Other Ambulatory Visit: Payer: Self-pay | Admitting: *Deleted

## 2020-11-05 ENCOUNTER — Other Ambulatory Visit: Payer: Self-pay

## 2020-11-05 ENCOUNTER — Inpatient Hospital Stay: Payer: Medicare Other | Attending: Hematology & Oncology

## 2020-11-05 ENCOUNTER — Inpatient Hospital Stay: Payer: Medicare Other

## 2020-11-05 DIAGNOSIS — D500 Iron deficiency anemia secondary to blood loss (chronic): Secondary | ICD-10-CM

## 2020-11-05 DIAGNOSIS — D751 Secondary polycythemia: Secondary | ICD-10-CM | POA: Diagnosis not present

## 2020-11-05 LAB — CBC WITH DIFFERENTIAL (CANCER CENTER ONLY)
Abs Immature Granulocytes: 0.03 10*3/uL (ref 0.00–0.07)
Basophils Absolute: 0.1 10*3/uL (ref 0.0–0.1)
Basophils Relative: 1 %
Eosinophils Absolute: 0.2 10*3/uL (ref 0.0–0.5)
Eosinophils Relative: 2 %
HCT: 43 % (ref 36.0–46.0)
Hemoglobin: 13.4 g/dL (ref 12.0–15.0)
Immature Granulocytes: 0 %
Lymphocytes Relative: 21 %
Lymphs Abs: 1.8 10*3/uL (ref 0.7–4.0)
MCH: 28.8 pg (ref 26.0–34.0)
MCHC: 31.2 g/dL (ref 30.0–36.0)
MCV: 92.3 fL (ref 80.0–100.0)
Monocytes Absolute: 0.8 10*3/uL (ref 0.1–1.0)
Monocytes Relative: 10 %
Neutro Abs: 5.7 10*3/uL (ref 1.7–7.7)
Neutrophils Relative %: 66 %
Platelet Count: 330 10*3/uL (ref 150–400)
RBC: 4.66 MIL/uL (ref 3.87–5.11)
RDW: 12.2 % (ref 11.5–15.5)
WBC Count: 8.5 10*3/uL (ref 4.0–10.5)
nRBC: 0 % (ref 0.0–0.2)

## 2020-11-05 LAB — CMP (CANCER CENTER ONLY)
ALT: 18 U/L (ref 0–44)
AST: 34 U/L (ref 15–41)
Albumin: 3.9 g/dL (ref 3.5–5.0)
Alkaline Phosphatase: 67 U/L (ref 38–126)
Anion gap: 10 (ref 5–15)
BUN: 11 mg/dL (ref 8–23)
CO2: 27 mmol/L (ref 22–32)
Calcium: 9 mg/dL (ref 8.9–10.3)
Chloride: 99 mmol/L (ref 98–111)
Creatinine: 0.63 mg/dL (ref 0.44–1.00)
GFR, Estimated: >60 mL/min (ref >60)
Glucose, Bld: 136 mg/dL — ABNORMAL HIGH (ref 70–99)
Potassium: 4.5 mmol/L (ref 3.5–5.1)
Sodium: 136 mmol/L (ref 135–145)
Total Bilirubin: 0.3 mg/dL (ref 0.3–1.2)
Total Protein: 7.2 g/dL (ref 6.5–8.1)

## 2020-11-08 LAB — FERRITIN: Ferritin: 10 ng/mL — ABNORMAL LOW (ref 11–307)

## 2020-11-08 LAB — IRON AND TIBC
Iron: 27 ug/dL — ABNORMAL LOW (ref 41–142)
Saturation Ratios: 6 % — ABNORMAL LOW (ref 21–57)
TIBC: 444 ug/dL (ref 236–444)
UIBC: 417 ug/dL — ABNORMAL HIGH (ref 120–384)

## 2020-11-19 ENCOUNTER — Inpatient Hospital Stay: Payer: Medicare Other

## 2020-11-19 ENCOUNTER — Other Ambulatory Visit: Payer: Self-pay

## 2020-11-19 VITALS — BP 115/74 | HR 56 | Temp 97.9°F | Resp 17

## 2020-11-19 DIAGNOSIS — D751 Secondary polycythemia: Secondary | ICD-10-CM

## 2020-11-19 DIAGNOSIS — D500 Iron deficiency anemia secondary to blood loss (chronic): Secondary | ICD-10-CM | POA: Diagnosis not present

## 2020-11-19 LAB — CMP (CANCER CENTER ONLY)
ALT: 49 U/L — ABNORMAL HIGH (ref 0–44)
AST: 32 U/L (ref 15–41)
Albumin: 4.2 g/dL (ref 3.5–5.0)
Alkaline Phosphatase: 86 U/L (ref 38–126)
Anion gap: 9 (ref 5–15)
BUN: 10 mg/dL (ref 8–23)
CO2: 29 mmol/L (ref 22–32)
Calcium: 9.9 mg/dL (ref 8.9–10.3)
Chloride: 102 mmol/L (ref 98–111)
Creatinine: 0.71 mg/dL (ref 0.44–1.00)
GFR, Estimated: >60 mL/min (ref >60)
Glucose, Bld: 115 mg/dL — ABNORMAL HIGH (ref 70–99)
Potassium: 5.3 mmol/L — ABNORMAL HIGH (ref 3.5–5.1)
Sodium: 140 mmol/L (ref 135–145)
Total Bilirubin: 0.4 mg/dL (ref 0.3–1.2)
Total Protein: 7.4 g/dL (ref 6.5–8.1)

## 2020-11-19 LAB — CBC WITH DIFFERENTIAL (CANCER CENTER ONLY)
Abs Immature Granulocytes: 0.04 10*3/uL (ref 0.00–0.07)
Basophils Absolute: 0.1 10*3/uL (ref 0.0–0.1)
Basophils Relative: 1 %
Eosinophils Absolute: 0.1 10*3/uL (ref 0.0–0.5)
Eosinophils Relative: 1 %
HCT: 48.1 % — ABNORMAL HIGH (ref 36.0–46.0)
Hemoglobin: 14.6 g/dL (ref 12.0–15.0)
Immature Granulocytes: 0 %
Lymphocytes Relative: 19 %
Lymphs Abs: 1.7 10*3/uL (ref 0.7–4.0)
MCH: 27.5 pg (ref 26.0–34.0)
MCHC: 30.4 g/dL (ref 30.0–36.0)
MCV: 90.6 fL (ref 80.0–100.0)
Monocytes Absolute: 1 10*3/uL (ref 0.1–1.0)
Monocytes Relative: 11 %
Neutro Abs: 6.3 10*3/uL (ref 1.7–7.7)
Neutrophils Relative %: 68 %
Platelet Count: 285 10*3/uL (ref 150–400)
RBC: 5.31 MIL/uL — ABNORMAL HIGH (ref 3.87–5.11)
RDW: 12.5 % (ref 11.5–15.5)
WBC Count: 9.2 10*3/uL (ref 4.0–10.5)
nRBC: 0 % (ref 0.0–0.2)

## 2020-11-19 MED ORDER — SODIUM CHLORIDE 0.9 % IV SOLN
Freq: Once | INTRAVENOUS | Status: AC
Start: 2020-11-19 — End: 2020-11-19

## 2020-11-19 NOTE — Patient Instructions (Signed)
Therapeutic Phlebotomy Therapeutic phlebotomy is the planned removal of blood from a person's body for the purpose of treating a medical condition. The procedure is similar to donating blood. Usually, about a pint (470 mL, or 0.47 L) of blood is removed.The average adult has 9-12 pints (4.3-5.7 L) of blood in the body. Therapeutic phlebotomy may be used to treat the following medical conditions: Hemochromatosis. This is a condition in which the blood contains too much iron. Polycythemia vera. This is a condition in which the blood contains too many red blood cells. Porphyria cutanea tarda. This is a disease in which an important part of hemoglobin is not made properly. It results in the buildup of abnormal amounts of porphyrins in the body. Sickle cell disease. This is a condition in which the red blood cells form an abnormal crescent shape rather than a round shape. Tell a health care provider about: Any allergies you have. All medicines you are taking, including vitamins, herbs, eye drops, creams, and over-the-counter medicines. Any problems you or family members have had with anesthetic medicines. Any blood disorders you have. Any surgeries you have had. Any medical conditions you have. Whether you are pregnant or may be pregnant. What are the risks? Generally, this is a safe procedure. However, problems may occur, including: Nausea or light-headedness. Low blood pressure (hypotension). Soreness, bleeding, swelling, or bruising at the needle insertion site. Infection. What happens before the procedure? Follow instructions from your health care provider about eating or drinking restrictions. Ask your health care provider about: Changing or stopping your regular medicines. This is especially important if you are taking diabetes medicines or blood thinners (anticoagulants). Taking medicines such as aspirin and ibuprofen. These medicines can thin your blood. Do not take these medicines unless  your health care provider tells you to take them. Taking over-the-counter medicines, vitamins, herbs, and supplements. Wear clothing with sleeves that can be raised above the elbow. Plan to have someone take you home from the hospital or clinic. You may have a blood sample taken. Your blood pressure, pulse rate, and breathing rate will be measured. What happens during the procedure?  To lower your risk of infection: Your health care team will wash or sanitize their hands. Your skin will be cleaned with an antiseptic. You may be given a medicine to numb the area (local anesthetic). A tourniquet will be placed on your arm. A needle will be inserted into one of your veins. Tubing and a collection bag will be attached to that needle. Blood will flow through the needle and tubing into the collection bag. The collection bag will be placed lower than your arm to allow gravity to help the flow of blood into the bag. You may be asked to open and close your hand slowly and continually during the entire collection. After the specified amount of blood has been removed from your body, the collection bag and tubing will be clamped. The needle will be removed from your vein. Pressure will be held on the site of the needle insertion to stop the bleeding. A bandage (dressing) will be placed over the needle insertion site. The procedure may vary among health care providers and hospitals. What happens after the procedure? Your blood pressure, pulse rate, and breathing rate will be measured after the procedure. You will be encouraged to drink fluids. Your recovery will be assessed and monitored. You can return to your normal activities as told by your health care provider. Summary Therapeutic phlebotomy is the planned removal of   blood from a person's body for the purpose of treating a medical condition. Therapeutic phlebotomy may be used to treat hemochromatosis, polycythemia vera, porphyria cutanea tarda,  or sickle cell disease. In the procedure, a needle is inserted and about a pint (470 mL, or 0.47 L) of blood is removed. The average adult has 9-12 pints (4.3-5.7 L) of blood in the body. This is generally a safe procedure, but it can sometimes cause problems such as nausea, light-headedness, or low blood pressure (hypotension). This information is not intended to replace advice given to you by your health care provider. Make sure you discuss any questions you have with your healthcare provider. Document Revised: 03/29/2017 Document Reviewed: 03/29/2017 Elsevier Patient Education  2022 [employer] Inc.  

## 2020-11-19 NOTE — Progress Notes (Signed)
Abigail Mullins presents today for phlebotomy per MD orders. Phlebotomy procedure started at 1440 and ended at 1458. 440 grams removed via 20 gauge needle to R AC. Pt started she was feeling lightheaded and dizzy. Normal saline bolus started to patent PIV. Patient observed for 20 minutes after procedure without any incident. Pt declined staying for 30 minutes stating "I feel fine and am ready to go" Patient tolerated procedure well. IV needle removed intact.

## 2020-11-22 ENCOUNTER — Other Ambulatory Visit: Payer: Medicare Other

## 2020-11-22 LAB — IRON AND TIBC
Iron: 34 ug/dL — ABNORMAL LOW (ref 41–142)
Saturation Ratios: 7 % — ABNORMAL LOW (ref 21–57)
TIBC: 473 ug/dL — ABNORMAL HIGH (ref 236–444)
UIBC: 439 ug/dL — ABNORMAL HIGH (ref 120–384)

## 2020-11-22 LAB — FERRITIN: Ferritin: 9 ng/mL — ABNORMAL LOW (ref 11–307)

## 2020-12-23 ENCOUNTER — Other Ambulatory Visit: Payer: Self-pay

## 2020-12-23 ENCOUNTER — Inpatient Hospital Stay: Payer: Medicare Other

## 2020-12-23 ENCOUNTER — Inpatient Hospital Stay: Payer: Medicare Other | Attending: Hematology & Oncology

## 2020-12-23 DIAGNOSIS — D751 Secondary polycythemia: Secondary | ICD-10-CM | POA: Diagnosis not present

## 2020-12-23 DIAGNOSIS — D500 Iron deficiency anemia secondary to blood loss (chronic): Secondary | ICD-10-CM | POA: Diagnosis not present

## 2020-12-23 LAB — CMP (CANCER CENTER ONLY)
ALT: 11 U/L (ref 0–44)
AST: 20 U/L (ref 15–41)
Albumin: 4 g/dL (ref 3.5–5.0)
Alkaline Phosphatase: 69 U/L (ref 38–126)
Anion gap: 7 (ref 5–15)
BUN: 9 mg/dL (ref 8–23)
CO2: 29 mmol/L (ref 22–32)
Calcium: 9.4 mg/dL (ref 8.9–10.3)
Chloride: 104 mmol/L (ref 98–111)
Creatinine: 0.65 mg/dL (ref 0.44–1.00)
GFR, Estimated: >60 mL/min (ref >60)
Glucose, Bld: 132 mg/dL — ABNORMAL HIGH (ref 70–99)
Potassium: 5.3 mmol/L — ABNORMAL HIGH (ref 3.5–5.1)
Sodium: 140 mmol/L (ref 135–145)
Total Bilirubin: 0.4 mg/dL (ref 0.3–1.2)
Total Protein: 6.9 g/dL (ref 6.5–8.1)

## 2020-12-23 LAB — CBC WITH DIFFERENTIAL (CANCER CENTER ONLY)
Abs Immature Granulocytes: 0.02 10*3/uL (ref 0.00–0.07)
Basophils Absolute: 0 10*3/uL (ref 0.0–0.1)
Basophils Relative: 1 %
Eosinophils Absolute: 0.1 10*3/uL (ref 0.0–0.5)
Eosinophils Relative: 1 %
HCT: 43 % (ref 36.0–46.0)
Hemoglobin: 12.8 g/dL (ref 12.0–15.0)
Immature Granulocytes: 0 %
Lymphocytes Relative: 23 %
Lymphs Abs: 1.9 10*3/uL (ref 0.7–4.0)
MCH: 25.9 pg — ABNORMAL LOW (ref 26.0–34.0)
MCHC: 29.8 g/dL — ABNORMAL LOW (ref 30.0–36.0)
MCV: 87 fL (ref 80.0–100.0)
Monocytes Absolute: 0.8 10*3/uL (ref 0.1–1.0)
Monocytes Relative: 10 %
Neutro Abs: 5.2 10*3/uL (ref 1.7–7.7)
Neutrophils Relative %: 65 %
Platelet Count: 318 10*3/uL (ref 150–400)
RBC: 4.94 MIL/uL (ref 3.87–5.11)
RDW: 14.6 % (ref 11.5–15.5)
WBC Count: 8.1 10*3/uL (ref 4.0–10.5)
nRBC: 0 % (ref 0.0–0.2)

## 2020-12-24 LAB — IRON AND TIBC
Iron: 23 ug/dL — ABNORMAL LOW (ref 41–142)
Saturation Ratios: 5 % — ABNORMAL LOW (ref 21–57)
TIBC: 428 ug/dL (ref 236–444)
UIBC: 405 ug/dL — ABNORMAL HIGH (ref 120–384)

## 2020-12-24 LAB — FERRITIN: Ferritin: 7 ng/mL — ABNORMAL LOW (ref 11–307)

## 2021-01-20 ENCOUNTER — Other Ambulatory Visit: Payer: Self-pay | Admitting: Family

## 2021-01-20 DIAGNOSIS — D500 Iron deficiency anemia secondary to blood loss (chronic): Secondary | ICD-10-CM

## 2021-01-20 DIAGNOSIS — D751 Secondary polycythemia: Secondary | ICD-10-CM

## 2021-01-21 ENCOUNTER — Other Ambulatory Visit: Payer: Self-pay

## 2021-01-21 ENCOUNTER — Encounter: Payer: Self-pay | Admitting: Family

## 2021-01-21 ENCOUNTER — Inpatient Hospital Stay: Payer: Medicare Other | Attending: Hematology & Oncology

## 2021-01-21 ENCOUNTER — Inpatient Hospital Stay: Payer: Medicare Other

## 2021-01-21 ENCOUNTER — Inpatient Hospital Stay (HOSPITAL_BASED_OUTPATIENT_CLINIC_OR_DEPARTMENT_OTHER): Payer: Medicare Other | Admitting: Family

## 2021-01-21 VITALS — BP 143/93 | HR 80 | Temp 98.6°F | Resp 18 | Wt 148.0 lb

## 2021-01-21 DIAGNOSIS — D500 Iron deficiency anemia secondary to blood loss (chronic): Secondary | ICD-10-CM | POA: Diagnosis not present

## 2021-01-21 DIAGNOSIS — D751 Secondary polycythemia: Secondary | ICD-10-CM

## 2021-01-21 LAB — CBC WITH DIFFERENTIAL (CANCER CENTER ONLY)
Abs Immature Granulocytes: 0.03 10*3/uL (ref 0.00–0.07)
Basophils Absolute: 0.1 10*3/uL (ref 0.0–0.1)
Basophils Relative: 1 %
Eosinophils Absolute: 0.2 10*3/uL (ref 0.0–0.5)
Eosinophils Relative: 2 %
HCT: 48 % — ABNORMAL HIGH (ref 36.0–46.0)
Hemoglobin: 13.9 g/dL (ref 12.0–15.0)
Immature Granulocytes: 0 %
Lymphocytes Relative: 38 %
Lymphs Abs: 3.2 10*3/uL (ref 0.7–4.0)
MCH: 25.8 pg — ABNORMAL LOW (ref 26.0–34.0)
MCHC: 29 g/dL — ABNORMAL LOW (ref 30.0–36.0)
MCV: 89.2 fL (ref 80.0–100.0)
Monocytes Absolute: 0.9 10*3/uL (ref 0.1–1.0)
Monocytes Relative: 10 %
Neutro Abs: 4.1 10*3/uL (ref 1.7–7.7)
Neutrophils Relative %: 49 %
Platelet Count: 337 10*3/uL (ref 150–400)
RBC: 5.38 MIL/uL — ABNORMAL HIGH (ref 3.87–5.11)
RDW: 17 % — ABNORMAL HIGH (ref 11.5–15.5)
WBC Count: 8.4 10*3/uL (ref 4.0–10.5)
nRBC: 0 % (ref 0.0–0.2)

## 2021-01-21 LAB — CMP (CANCER CENTER ONLY)
ALT: 9 U/L (ref 0–44)
AST: 18 U/L (ref 15–41)
Albumin: 4.1 g/dL (ref 3.5–5.0)
Alkaline Phosphatase: 72 U/L (ref 38–126)
Anion gap: 9 (ref 5–15)
BUN: 9 mg/dL (ref 8–23)
CO2: 31 mmol/L (ref 22–32)
Calcium: 10 mg/dL (ref 8.9–10.3)
Chloride: 103 mmol/L (ref 98–111)
Creatinine: 0.72 mg/dL (ref 0.44–1.00)
GFR, Estimated: >60 mL/min (ref >60)
Glucose, Bld: 145 mg/dL — ABNORMAL HIGH (ref 70–99)
Potassium: 5.6 mmol/L — ABNORMAL HIGH (ref 3.5–5.1)
Sodium: 143 mmol/L (ref 135–145)
Total Bilirubin: 0.6 mg/dL (ref 0.3–1.2)
Total Protein: 6.9 g/dL (ref 6.5–8.1)

## 2021-01-21 NOTE — Progress Notes (Signed)
Abigail Mullins presents today for phlebotomy per MD orders. Phlebotomy procedure started at 1430 and ended at 16. 517 grams removed from Rt AC using 22g IV cath.  Patient observed for 30 minutes after procedure without any incident. Patient tolerated procedure well. IV needle removed intact.

## 2021-01-21 NOTE — Patient Instructions (Signed)

## 2021-01-21 NOTE — Progress Notes (Signed)
Hematology and Oncology Follow Up Visit  Abigail Mullins [id number] November 21, 1950 70 y.o. 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 She is doing well but does note fatigue at times.  She has been going through a lot of dental work and will be having a dental implant placed.  No bruising or petechiae.  No fever, chills, hot flashes, night sweats, n/v, cough, rash, dizziness, SOB, chest pain, palpitations, abdominal pain or changes in bowel or bladder habits.  No swelling, tenderness in her extremities at this time.   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  No falls or syncope to report.  He has maintained a good appetite and is staying well hydrated. Her weight is stable at 148 lbs.   ECOG Performance Status: 1 - Symptomatic but completely ambulatory  Medications:  Allergies as of 01/21/2021   No Known Allergies      Medication List        Accurate as of January 21, 2021  1:57 PM. If you have any questions, ask your nurse or doctor.          amLODipine 5 MG tablet Commonly known as: NORVASC Take 5 mg by mouth daily.   fentaNYL 50 MCG/HR Commonly known as: DURAGESIC Place 1 patch onto the skin 3 times/day as needed-between meals & bedtime.   fexofenadine 60 MG tablet Commonly known as: ALLEGRA Take 60 mg by mouth as needed.   ibuprofen 800 MG tablet Commonly known as: ADVIL Take 800 mg by mouth as needed.   metoprolol succinate 25 MG 24 hr tablet Commonly known as: TOPROL-XL Take 25 mg by mouth daily.   oxyCODONE-acetaminophen 5-325 MG tablet Commonly known as: PERCOCET/ROXICET Take 1 tablet by mouth as needed.   polyethylene glycol 17 g packet Commonly known as: MIRALAX / GLYCOLAX Mix 1 capful in 8  ounces of fluid   SYSTANE ULTRA PF OP Apply to eye as needed.        Allergies: No Known Allergies  Past Medical History, Surgical history, Social history, and Family History were reviewed and updated.  Review of Systems: All other 10 point review of systems is negative.   Physical Exam:  weight is 148 lb (67.1 kg). Her oral temperature is 98.6 F (37 C). Her blood pressure is 143/93 (abnormal) and her pulse is 80. Her respiration is 18 and oxygen saturation is 95%.   Wt Readings from Last 3 Encounters:  01/21/21 148 lb (67.1 kg)  10/22/20 151 lb 12.8 oz (68.9 kg)    Ocular: Sclerae unicteric, pupils equal, round and reactive to light Ear-nose-throat: Oropharynx clear, dentition fair Lymphatic: No cervical or supraclavicular adenopathy Lungs no rales or rhonchi, good excursion bilaterally Heart regular rate and rhythm, no murmur appreciated Abd soft, nontender, positive bowel sounds MSK no focal spinal tenderness, no joint edema Neuro: non-focal, well-oriented, appropriate affect Breasts: Deferred  Lab Results  Component Value Date   WBC 8.4 01/21/2021   HGB 13.9 01/21/2021   HCT 48.0 (H) 01/21/2021   MCV 89.2 01/21/2021   PLT 337 01/21/2021   Lab Results  Component Value Date   FERRITIN 7 (L) 12/23/2020   IRON 23 (L) 12/23/2020   TIBC 428 12/23/2020   UIBC 405 (H) 12/23/2020  IRONPCTSAT 5 (L) 12/23/2020   Lab Results  Component Value Date   RETICCTPCT 4.4 (H) 08/24/2020   RBC 5.38 (H) 01/21/2021   No results found for: KPAFRELGTCHN, LAMBDASER, KAPLAMBRATIO No results found for: IGGSERUM, IGA, IGMSERUM No results found for: Marda Stalker, SPEI   Chemistry      Component Value Date/Time   NA 143 01/21/2021 1323   K 5.6 (H) 01/21/2021 1323   CL 103 01/21/2021 1323   CO2 31 01/21/2021 1323   BUN 9 01/21/2021 1323   CREATININE 0.72 01/21/2021 1323      Component Value Date/Time   CALCIUM 10.0  01/21/2021 1323   ALKPHOS 72 01/21/2021 1323   AST 18 01/21/2021 1323   ALT 9 01/21/2021 1323   BILITOT 0.6 01/21/2021 1323       Impression and Plan: Abigail Mullins is a very pleasant 70 yo caucasian female with polycythemia, secondary. JAK 2 and hemochromatosis DNA were both negative. Lab check and phlebotomy appointment in 6 weeks and follow-up in 3 months.  She can contact our office with any questions or concerns.   Eileen Stanford, NP 10/28/20221:57 PM

## 2021-01-24 ENCOUNTER — Telehealth: Payer: Self-pay | Admitting: *Deleted

## 2021-01-24 LAB — IRON AND TIBC
Iron: 34 ug/dL — ABNORMAL LOW (ref 41–142)
Saturation Ratios: 8 % — ABNORMAL LOW (ref 21–57)
TIBC: 439 ug/dL (ref 236–444)
UIBC: 405 ug/dL — ABNORMAL HIGH (ref 120–384)

## 2021-01-24 LAB — FERRITIN: Ferritin: 9 ng/mL — ABNORMAL LOW (ref 11–307)

## 2021-01-24 NOTE — Telephone Encounter (Signed)
Per 01/21/21 los - called and lvm of upcoming appointments - requested callback to confirm - mailed updated calendar

## 2021-01-25 DIAGNOSIS — Z9889 Other specified postprocedural states: Secondary | ICD-10-CM | POA: Diagnosis not present

## 2021-01-25 DIAGNOSIS — Z1211 Encounter for screening for malignant neoplasm of colon: Secondary | ICD-10-CM | POA: Diagnosis not present

## 2021-01-25 DIAGNOSIS — Z13220 Encounter for screening for lipoid disorders: Secondary | ICD-10-CM | POA: Diagnosis not present

## 2021-01-25 DIAGNOSIS — Z0000 Encounter for general adult medical examination without abnormal findings: Secondary | ICD-10-CM | POA: Diagnosis not present

## 2021-01-25 DIAGNOSIS — G894 Chronic pain syndrome: Secondary | ICD-10-CM | POA: Diagnosis not present

## 2021-01-25 DIAGNOSIS — I10 Essential (primary) hypertension: Secondary | ICD-10-CM | POA: Diagnosis not present

## 2021-01-25 DIAGNOSIS — R7301 Impaired fasting glucose: Secondary | ICD-10-CM | POA: Diagnosis not present

## 2021-03-04 ENCOUNTER — Inpatient Hospital Stay: Payer: Medicare Other | Attending: Hematology & Oncology

## 2021-03-04 ENCOUNTER — Other Ambulatory Visit: Payer: Self-pay

## 2021-03-04 ENCOUNTER — Inpatient Hospital Stay: Payer: Medicare Other

## 2021-03-04 DIAGNOSIS — D751 Secondary polycythemia: Secondary | ICD-10-CM | POA: Diagnosis not present

## 2021-03-04 DIAGNOSIS — Z8639 Personal history of other endocrine, nutritional and metabolic disease: Secondary | ICD-10-CM | POA: Insufficient documentation

## 2021-03-04 DIAGNOSIS — D500 Iron deficiency anemia secondary to blood loss (chronic): Secondary | ICD-10-CM

## 2021-03-04 LAB — CBC WITH DIFFERENTIAL (CANCER CENTER ONLY)
Abs Immature Granulocytes: 0.02 10*3/uL (ref 0.00–0.07)
Basophils Absolute: 0 10*3/uL (ref 0.0–0.1)
Basophils Relative: 0 %
Eosinophils Absolute: 0.1 10*3/uL (ref 0.0–0.5)
Eosinophils Relative: 1 %
HCT: 41.9 % (ref 36.0–46.0)
Hemoglobin: 12.6 g/dL (ref 12.0–15.0)
Immature Granulocytes: 0 %
Lymphocytes Relative: 21 %
Lymphs Abs: 1.9 10*3/uL (ref 0.7–4.0)
MCH: 25.7 pg — ABNORMAL LOW (ref 26.0–34.0)
MCHC: 30.1 g/dL (ref 30.0–36.0)
MCV: 85.3 fL (ref 80.0–100.0)
Monocytes Absolute: 0.8 10*3/uL (ref 0.1–1.0)
Monocytes Relative: 9 %
Neutro Abs: 6.2 10*3/uL (ref 1.7–7.7)
Neutrophils Relative %: 69 %
Platelet Count: 278 10*3/uL (ref 150–400)
RBC: 4.91 MIL/uL (ref 3.87–5.11)
RDW: 16.1 % — ABNORMAL HIGH (ref 11.5–15.5)
WBC Count: 9 10*3/uL (ref 4.0–10.5)
nRBC: 0 % (ref 0.0–0.2)

## 2021-03-04 LAB — CMP (CANCER CENTER ONLY)
ALT: 14 U/L (ref 0–44)
AST: 18 U/L (ref 15–41)
Albumin: 4.2 g/dL (ref 3.5–5.0)
Alkaline Phosphatase: 66 U/L (ref 38–126)
Anion gap: 9 (ref 5–15)
BUN: 12 mg/dL (ref 8–23)
CO2: 27 mmol/L (ref 22–32)
Calcium: 9.7 mg/dL (ref 8.9–10.3)
Chloride: 101 mmol/L (ref 98–111)
Creatinine: 0.65 mg/dL (ref 0.44–1.00)
GFR, Estimated: >60 mL/min (ref >60)
Glucose, Bld: 110 mg/dL — ABNORMAL HIGH (ref 70–99)
Potassium: 3.8 mmol/L (ref 3.5–5.1)
Sodium: 137 mmol/L (ref 135–145)
Total Bilirubin: 0.3 mg/dL (ref 0.3–1.2)
Total Protein: 6.8 g/dL (ref 6.5–8.1)

## 2021-03-04 NOTE — Progress Notes (Signed)
Patient's labs do not meet criteria for phlebotomy. This RN gave patient a copy of labs and educated pt that phlebotomy is not indicated. Pt verbalized understanding and had no further questions. Pt left ambulatory in no apparent distress.

## 2021-03-07 LAB — IRON AND TIBC
Iron: 37 ug/dL — ABNORMAL LOW (ref 41–142)
Saturation Ratios: 8 % — ABNORMAL LOW (ref 21–57)
TIBC: 451 ug/dL — ABNORMAL HIGH (ref 236–444)
UIBC: 415 ug/dL — ABNORMAL HIGH (ref 120–384)

## 2021-03-07 LAB — FERRITIN: Ferritin: 6 ng/mL — ABNORMAL LOW (ref 11–307)

## 2021-03-29 DIAGNOSIS — R1010 Upper abdominal pain, unspecified: Secondary | ICD-10-CM | POA: Diagnosis not present

## 2021-04-06 DIAGNOSIS — H26492 Other secondary cataract, left eye: Secondary | ICD-10-CM | POA: Diagnosis not present

## 2021-04-06 DIAGNOSIS — H15001 Unspecified scleritis, right eye: Secondary | ICD-10-CM | POA: Diagnosis not present

## 2021-04-06 DIAGNOSIS — H0288A Meibomian gland dysfunction right eye, upper and lower eyelids: Secondary | ICD-10-CM | POA: Diagnosis not present

## 2021-04-06 DIAGNOSIS — Z9841 Cataract extraction status, right eye: Secondary | ICD-10-CM | POA: Diagnosis not present

## 2021-04-22 ENCOUNTER — Inpatient Hospital Stay: Payer: Medicare Other | Attending: Hematology & Oncology

## 2021-04-22 ENCOUNTER — Encounter: Payer: Self-pay | Admitting: Family

## 2021-04-22 ENCOUNTER — Inpatient Hospital Stay: Payer: Medicare Other | Admitting: Family

## 2021-04-22 ENCOUNTER — Other Ambulatory Visit: Payer: Self-pay

## 2021-04-22 ENCOUNTER — Inpatient Hospital Stay: Payer: Medicare Other

## 2021-04-22 VITALS — BP 142/73 | HR 61 | Temp 98.6°F | Resp 17 | Wt 150.0 lb

## 2021-04-22 DIAGNOSIS — D500 Iron deficiency anemia secondary to blood loss (chronic): Secondary | ICD-10-CM

## 2021-04-22 DIAGNOSIS — D751 Secondary polycythemia: Secondary | ICD-10-CM | POA: Insufficient documentation

## 2021-04-22 DIAGNOSIS — G8929 Other chronic pain: Secondary | ICD-10-CM | POA: Insufficient documentation

## 2021-04-22 LAB — CMP (CANCER CENTER ONLY)
ALT: 14 U/L (ref 0–44)
AST: 22 U/L (ref 15–41)
Albumin: 4.4 g/dL (ref 3.5–5.0)
Alkaline Phosphatase: 59 U/L (ref 38–126)
Anion gap: 7 (ref 5–15)
BUN: 15 mg/dL (ref 8–23)
CO2: 28 mmol/L (ref 22–32)
Calcium: 9.7 mg/dL (ref 8.9–10.3)
Chloride: 102 mmol/L (ref 98–111)
Creatinine: 0.66 mg/dL (ref 0.44–1.00)
GFR, Estimated: >60 mL/min (ref >60)
Glucose, Bld: 106 mg/dL — ABNORMAL HIGH (ref 70–99)
Potassium: 4.2 mmol/L (ref 3.5–5.1)
Sodium: 137 mmol/L (ref 135–145)
Total Bilirubin: 0.4 mg/dL (ref 0.3–1.2)
Total Protein: 7.1 g/dL (ref 6.5–8.1)

## 2021-04-22 LAB — CBC WITH DIFFERENTIAL (CANCER CENTER ONLY)
Abs Immature Granulocytes: 0.03 10*3/uL (ref 0.00–0.07)
Basophils Absolute: 0 10*3/uL (ref 0.0–0.1)
Basophils Relative: 0 %
Eosinophils Absolute: 0 10*3/uL (ref 0.0–0.5)
Eosinophils Relative: 0 %
HCT: 42.5 % (ref 36.0–46.0)
Hemoglobin: 12.9 g/dL (ref 12.0–15.0)
Immature Granulocytes: 0 %
Lymphocytes Relative: 17 %
Lymphs Abs: 1.4 10*3/uL (ref 0.7–4.0)
MCH: 26.2 pg (ref 26.0–34.0)
MCHC: 30.4 g/dL (ref 30.0–36.0)
MCV: 86.4 fL (ref 80.0–100.0)
Monocytes Absolute: 0.6 10*3/uL (ref 0.1–1.0)
Monocytes Relative: 8 %
Neutro Abs: 6.3 10*3/uL (ref 1.7–7.7)
Neutrophils Relative %: 75 %
Platelet Count: 310 10*3/uL (ref 150–400)
RBC: 4.92 MIL/uL (ref 3.87–5.11)
RDW: 16.1 % — ABNORMAL HIGH (ref 11.5–15.5)
WBC Count: 8.4 10*3/uL (ref 4.0–10.5)
nRBC: 0 % (ref 0.0–0.2)

## 2021-04-22 NOTE — Progress Notes (Signed)
Hematology and Oncology Follow Up Visit  Abigail Mullins [id number] 06-26-1950 71 y.o. 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 is here today for follow-up. She is doing well but has chronic back pain. She has been taking it easy and resting as needed.  She notes mild SOB with exertion which she attributes to being deconditioned.  No fever, chills, n/v, cough, rash, dizziness, SOB, chest pain, palpitations, abdominal pain or changes in bowel or bladder habits.  No swelling, tenderness in her extremities.  She has tingling in her hands and feet which is unchanged from baseline.  No falls or syncope.  She has maintained a good appetite and is staying well hydrated. Her weight is stable at 150 lbs.   ECOG Performance Status: 1 - Symptomatic but completely ambulatory  Medications:  Allergies as of 04/22/2021   No Known Allergies      Medication List        Accurate as of April 22, 2021  3:44 PM. If you have any questions, ask your nurse or doctor.          amLODipine 5 MG tablet Commonly known as: NORVASC Take 5 mg by mouth daily.   fentaNYL 50 MCG/HR Commonly known as: DURAGESIC Place 1 patch onto the skin 3 times/day as needed-between meals & bedtime.   fexofenadine 60 MG tablet Commonly known as: ALLEGRA Take 60 mg by mouth as needed.   ibuprofen 800 MG tablet Commonly known as: ADVIL Take 800 mg by mouth as needed.   metoprolol succinate 25 MG 24 hr tablet Commonly known as: TOPROL-XL Take 25 mg by mouth daily.   oxyCODONE-acetaminophen 5-325 MG tablet Commonly known as: PERCOCET/ROXICET Take 1 tablet by mouth as needed.   polyethylene glycol 17 g packet Commonly known as: MIRALAX / GLYCOLAX Mix 1 capful in 8 ounces of fluid   SYSTANE ULTRA PF OP Apply to eye as  needed.        Allergies: No Known Allergies  Past Medical History, Surgical history, Social history, and Family History were reviewed and updated.  Review of Systems: All other 10 point review of systems is negative.   Physical Exam:  weight is 150 lb (68 kg). Her oral temperature is 98.6 F (37 C). Her blood pressure is 142/73 (abnormal) and her pulse is 61. Her respiration is 17 and oxygen saturation is 98%.   Wt Readings from Last 3 Encounters:  04/22/21 150 lb (68 kg)  01/21/21 148 lb (67.1 kg)  10/22/20 151 lb 12.8 oz (68.9 kg)    Ocular: Sclerae unicteric, pupils equal, round and reactive to light Ear-nose-throat: Oropharynx clear, dentition fair Lymphatic: No cervical or supraclavicular adenopathy Lungs no rales or rhonchi, good excursion bilaterally Heart regular rate and rhythm, no murmur appreciated Abd soft, nontender, positive bowel sounds MSK no focal spinal tenderness, no joint edema Neuro: non-focal, well-oriented, appropriate affect Breasts: Deferred   Lab Results  Component Value Date   WBC 8.4 04/22/2021   HGB 12.9 04/22/2021   HCT 42.5 04/22/2021   MCV 86.4 04/22/2021   PLT 310 04/22/2021   Lab Results  Component Value Date   FERRITIN 6 (L) 03/04/2021   IRON 37 (L) 03/04/2021   TIBC 451 (H) 03/04/2021   UIBC 415 (H) 03/04/2021   IRONPCTSAT 8 (L)  03/04/2021   Lab Results  Component Value Date   RETICCTPCT 4.4 (H) 08/24/2020   RBC 4.92 04/22/2021   No results found for: KPAFRELGTCHN, LAMBDASER, KAPLAMBRATIO No results found for: IGGSERUM, IGA, IGMSERUM No results found for: Dorene Ar, A1GS, A2GS, Karn Pickler, SPEI   Chemistry      Component Value Date/Time   NA 137 04/22/2021 1429   K 4.2 04/22/2021 1429   CL 102 04/22/2021 1429   CO2 28 04/22/2021 1429   BUN 15 04/22/2021 1429   CREATININE 0.66 04/22/2021 1429      Component Value Date/Time   CALCIUM 9.7 04/22/2021 1429   ALKPHOS 59 04/22/2021 1429    AST 22 04/22/2021 1429   ALT 14 04/22/2021 1429   BILITOT 0.4 04/22/2021 1429       Impression and Plan: [name] is a very pleasant 71 yo caucasian female with polycythemia, secondary. JAK 2 and hemochromatosis DNA were both negative. No phlebotomy today, Hct 42%.   Lab check and phlebotomy in 8 weeks, Follow-up in 4 months.    Eileen Stanford, NP 1/27/20233:44 PM

## 2021-04-25 LAB — IRON AND IRON BINDING CAPACITY (CC-WL,HP ONLY)
Iron: 94 ug/dL (ref 28–170)
Saturation Ratios: 19 % (ref 10.4–31.8)
TIBC: 494 ug/dL — ABNORMAL HIGH (ref 250–450)
UIBC: 400 ug/dL (ref 148–442)

## 2021-04-25 LAB — FERRITIN: Ferritin: 10 ng/mL — ABNORMAL LOW (ref 11–307)

## 2021-04-27 DIAGNOSIS — G894 Chronic pain syndrome: Secondary | ICD-10-CM | POA: Diagnosis not present

## 2021-04-27 DIAGNOSIS — I10 Essential (primary) hypertension: Secondary | ICD-10-CM | POA: Diagnosis not present

## 2021-04-27 DIAGNOSIS — Z9889 Other specified postprocedural states: Secondary | ICD-10-CM | POA: Diagnosis not present

## 2021-06-17 ENCOUNTER — Other Ambulatory Visit: Payer: Self-pay

## 2021-06-17 ENCOUNTER — Inpatient Hospital Stay: Payer: Medicare Other | Attending: Hematology & Oncology

## 2021-06-17 ENCOUNTER — Inpatient Hospital Stay: Payer: Medicare Other

## 2021-06-17 DIAGNOSIS — D751 Secondary polycythemia: Secondary | ICD-10-CM | POA: Diagnosis not present

## 2021-06-17 LAB — CBC WITH DIFFERENTIAL (CANCER CENTER ONLY)
Abs Immature Granulocytes: 0.02 10*3/uL (ref 0.00–0.07)
Basophils Absolute: 0 10*3/uL (ref 0.0–0.1)
Basophils Relative: 0 %
Eosinophils Absolute: 0.1 10*3/uL (ref 0.0–0.5)
Eosinophils Relative: 1 %
HCT: 47.2 % — ABNORMAL HIGH (ref 36.0–46.0)
Hemoglobin: 14.4 g/dL (ref 12.0–15.0)
Immature Granulocytes: 0 %
Lymphocytes Relative: 30 %
Lymphs Abs: 2.3 10*3/uL (ref 0.7–4.0)
MCH: 28 pg (ref 26.0–34.0)
MCHC: 30.5 g/dL (ref 30.0–36.0)
MCV: 91.8 fL (ref 80.0–100.0)
Monocytes Absolute: 0.9 10*3/uL (ref 0.1–1.0)
Monocytes Relative: 12 %
Neutro Abs: 4.2 10*3/uL (ref 1.7–7.7)
Neutrophils Relative %: 57 %
Platelet Count: 257 10*3/uL (ref 150–400)
RBC: 5.14 MIL/uL — ABNORMAL HIGH (ref 3.87–5.11)
RDW: 15.4 % (ref 11.5–15.5)
WBC Count: 7.5 10*3/uL (ref 4.0–10.5)
nRBC: 0 % (ref 0.0–0.2)

## 2021-06-17 LAB — CMP (CANCER CENTER ONLY)
ALT: 12 U/L (ref 0–44)
AST: 19 U/L (ref 15–41)
Albumin: 4.3 g/dL (ref 3.5–5.0)
Alkaline Phosphatase: 59 U/L (ref 38–126)
Anion gap: 5 (ref 5–15)
BUN: 15 mg/dL (ref 8–23)
CO2: 29 mmol/L (ref 22–32)
Calcium: 9.6 mg/dL (ref 8.9–10.3)
Chloride: 104 mmol/L (ref 98–111)
Creatinine: 0.72 mg/dL (ref 0.44–1.00)
GFR, Estimated: >60 mL/min (ref >60)
Glucose, Bld: 98 mg/dL (ref 70–99)
Potassium: 4.8 mmol/L (ref 3.5–5.1)
Sodium: 138 mmol/L (ref 135–145)
Total Bilirubin: 0.4 mg/dL (ref 0.3–1.2)
Total Protein: 7 g/dL (ref 6.5–8.1)

## 2021-06-17 NOTE — Progress Notes (Signed)
Abigail Mullins presents today for phlebotomy per MD orders. ?Phlebotomy procedure started at 1420 and ended at 1443. ?523 grams removed from rt AC using 18g IV cath by [facility], RN ?Patient observed for 30 minutes after procedure without any incident. ?Patient tolerated procedure well. ?IV needle removed intact. ? ? ?

## 2021-06-17 NOTE — Patient Instructions (Signed)

## 2021-06-20 LAB — IRON AND IRON BINDING CAPACITY (CC-WL,HP ONLY)
Iron: 77 ug/dL (ref 28–170)
Saturation Ratios: 16 % (ref 10.4–31.8)
TIBC: 482 ug/dL — ABNORMAL HIGH (ref 250–450)
UIBC: 405 ug/dL (ref 148–442)

## 2021-06-20 LAB — FERRITIN: Ferritin: 11 ng/mL (ref 11–307)

## 2021-07-19 DIAGNOSIS — Z9889 Other specified postprocedural states: Secondary | ICD-10-CM | POA: Diagnosis not present

## 2021-07-19 DIAGNOSIS — G894 Chronic pain syndrome: Secondary | ICD-10-CM | POA: Diagnosis not present

## 2021-08-19 ENCOUNTER — Inpatient Hospital Stay: Payer: Medicare Other

## 2021-08-19 ENCOUNTER — Inpatient Hospital Stay (HOSPITAL_BASED_OUTPATIENT_CLINIC_OR_DEPARTMENT_OTHER): Payer: Medicare Other | Admitting: Family

## 2021-08-19 ENCOUNTER — Inpatient Hospital Stay: Payer: Medicare Other | Attending: Hematology & Oncology

## 2021-08-19 ENCOUNTER — Other Ambulatory Visit: Payer: Self-pay

## 2021-08-19 ENCOUNTER — Encounter: Payer: Self-pay | Admitting: Family

## 2021-08-19 VITALS — BP 112/63 | HR 61 | Temp 97.7°F | Resp 18 | Ht 67.0 in | Wt 145.1 lb

## 2021-08-19 VITALS — BP 110/59 | HR 65

## 2021-08-19 DIAGNOSIS — D751 Secondary polycythemia: Secondary | ICD-10-CM | POA: Diagnosis not present

## 2021-08-19 DIAGNOSIS — D500 Iron deficiency anemia secondary to blood loss (chronic): Secondary | ICD-10-CM | POA: Diagnosis not present

## 2021-08-19 DIAGNOSIS — G629 Polyneuropathy, unspecified: Secondary | ICD-10-CM | POA: Diagnosis not present

## 2021-08-19 LAB — CMP (CANCER CENTER ONLY)
ALT: 18 U/L (ref 0–44)
AST: 21 U/L (ref 15–41)
Albumin: 4.5 g/dL (ref 3.5–5.0)
Alkaline Phosphatase: 80 U/L (ref 38–126)
Anion gap: 8 (ref 5–15)
BUN: 14 mg/dL (ref 8–23)
CO2: 30 mmol/L (ref 22–32)
Calcium: 10 mg/dL (ref 8.9–10.3)
Chloride: 104 mmol/L (ref 98–111)
Creatinine: 0.76 mg/dL (ref 0.44–1.00)
GFR, Estimated: >60 mL/min (ref >60)
Glucose, Bld: 124 mg/dL — ABNORMAL HIGH (ref 70–99)
Potassium: 5.4 mmol/L — ABNORMAL HIGH (ref 3.5–5.1)
Sodium: 142 mmol/L (ref 135–145)
Total Bilirubin: 0.4 mg/dL (ref 0.3–1.2)
Total Protein: 7.7 g/dL (ref 6.5–8.1)

## 2021-08-19 LAB — IRON AND IRON BINDING CAPACITY (CC-WL,HP ONLY)
Iron: 34 ug/dL (ref 28–170)
Saturation Ratios: 7 % — ABNORMAL LOW (ref 10.4–31.8)
TIBC: 500 ug/dL — ABNORMAL HIGH (ref 250–450)
UIBC: 466 ug/dL — ABNORMAL HIGH (ref 148–442)

## 2021-08-19 LAB — CBC WITH DIFFERENTIAL (CANCER CENTER ONLY)
Abs Immature Granulocytes: 0.04 10*3/uL (ref 0.00–0.07)
Basophils Absolute: 0 10*3/uL (ref 0.0–0.1)
Basophils Relative: 0 %
Eosinophils Absolute: 0 10*3/uL (ref 0.0–0.5)
Eosinophils Relative: 0 %
HCT: 50.2 % — ABNORMAL HIGH (ref 36.0–46.0)
Hemoglobin: 15.4 g/dL — ABNORMAL HIGH (ref 12.0–15.0)
Immature Granulocytes: 0 %
Lymphocytes Relative: 21 %
Lymphs Abs: 2.1 10*3/uL (ref 0.7–4.0)
MCH: 27.8 pg (ref 26.0–34.0)
MCHC: 30.7 g/dL (ref 30.0–36.0)
MCV: 90.8 fL (ref 80.0–100.0)
Monocytes Absolute: 0.8 10*3/uL (ref 0.1–1.0)
Monocytes Relative: 9 %
Neutro Abs: 6.9 10*3/uL (ref 1.7–7.7)
Neutrophils Relative %: 70 %
Platelet Count: 311 10*3/uL (ref 150–400)
RBC: 5.53 MIL/uL — ABNORMAL HIGH (ref 3.87–5.11)
RDW: 13.6 % (ref 11.5–15.5)
WBC Count: 9.9 10*3/uL (ref 4.0–10.5)
nRBC: 0 % (ref 0.0–0.2)

## 2021-08-19 LAB — FERRITIN: Ferritin: 7 ng/mL — ABNORMAL LOW (ref 11–307)

## 2021-08-19 NOTE — Patient Instructions (Signed)

## 2021-08-19 NOTE — Progress Notes (Signed)
Hematology and Oncology Follow Up Visit  Abigail Mullins [id number] 12-08-1950 71 y.o. 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 Hct today is 50%.  She is feeling fatigued and has chronic back pain.  No fever, chills, n/v, cough, rash, dizziness, SOB, chest pain, palpitations, abdominal pain or changes in bowel or bladder habits.  No swelling in her extremities.  Neuropathy in her hands and feet is unchanged from baseline.  No falls or syncope to report.  Appetite and hydration are good. Weight is stable at 145 lbs.   ECOG Performance Status: 1 - Symptomatic but completely ambulatory  Medications:  Allergies as of 08/19/2021   No Known Allergies      Medication List        Accurate as of Aug 19, 2021 12:58 PM. If you have any questions, ask your nurse or doctor.          amLODipine 5 MG tablet Commonly known as: NORVASC Take 5 mg by mouth daily.   fentaNYL 50 MCG/HR Commonly known as: DURAGESIC Place 1 patch onto the skin 3 times/day as needed-between meals & bedtime.   fexofenadine 60 MG tablet Commonly known as: ALLEGRA Take 60 mg by mouth as needed.   ibuprofen 800 MG tablet Commonly known as: ADVIL Take 800 mg by mouth as needed.   metoprolol succinate 25 MG 24 hr tablet Commonly known as: TOPROL-XL Take 25 mg by mouth daily.   oxyCODONE-acetaminophen 5-325 MG tablet Commonly known as: PERCOCET/ROXICET Take 1 tablet by mouth as needed.   polyethylene glycol 17 g packet Commonly known as: MIRALAX / GLYCOLAX Mix 1 capful in 8 ounces of fluid   SYSTANE ULTRA PF OP Apply to eye as needed.        Allergies: No Known Allergies  Past Medical History, Surgical history, Social history, and Family History were reviewed and  updated.  Review of Systems: All other 10 point review of systems is negative.   Physical Exam:  vitals were not taken for this visit.   Wt Readings from Last 3 Encounters:  04/22/21 150 lb (68 kg)  01/21/21 148 lb (67.1 kg)  10/22/20 151 lb 12.8 oz (68.9 kg)    Ocular: Sclerae unicteric, pupils equal, round and reactive to light Ear-nose-throat: Oropharynx clear, dentition fair Lymphatic: No cervical or supraclavicular adenopathy Lungs no rales or rhonchi, good excursion bilaterally Heart regular rate and rhythm, no murmur appreciated Abd soft, nontender, positive bowel sounds MSK no focal spinal tenderness, no joint edema Neuro: non-focal, well-oriented, appropriate affect Breasts: Deferred   Lab Results  Component Value Date   WBC 9.9 08/19/2021   HGB 15.4 (H) 08/19/2021   HCT 50.2 (H) 08/19/2021   MCV 90.8 08/19/2021   PLT 311 08/19/2021   Lab Results  Component Value Date   FERRITIN 11 06/17/2021   IRON 77 06/17/2021   TIBC 482 (H) 06/17/2021   UIBC 405 06/17/2021   IRONPCTSAT 16 06/17/2021   Lab Results  Component Value Date   RETICCTPCT 4.4 (H) 08/24/2020   RBC 5.53 (H) 08/19/2021   No results found for: KPAFRELGTCHN, LAMBDASER, KAPLAMBRATIO No results found for: IGGSERUM, IGA, IGMSERUM No results found for: TOTALPROTELP, ALBUMINELP, A1GS, A2GS, BETS, BETA2SER, GAMS, MSPIKE, SPEI   Chemistry  Component Value Date/Time   NA 142 08/19/2021 1202   K 5.4 (H) 08/19/2021 1202   CL 104 08/19/2021 1202   CO2 30 08/19/2021 1202   BUN 14 08/19/2021 1202   CREATININE 0.76 08/19/2021 1202      Component Value Date/Time   CALCIUM 10.0 08/19/2021 1202   ALKPHOS 80 08/19/2021 1202   AST 21 08/19/2021 1202   ALT 18 08/19/2021 1202   BILITOT 0.4 08/19/2021 1202       Impression and Plan: Abigail Mullins is a very pleasant 71 yo caucasian female with polycythemia, secondary. JAK 2 and hemochromatosis DNA were both negative. Phlebotomy today and again next  week please, Hct 50%.  Lab check and phlebotomy every 6 weeks, follow-up in 3 months.   Eileen Stanford, NP 5/26/202312:58 PM

## 2021-08-19 NOTE — Progress Notes (Signed)
Abigail Mullins presents today for phlebotomy per MD orders. Phlebotomy procedure started at 1331 and ended at 1407. 300 cc removed via 20 G needle at [facility] site.Blood flow stopped and patient refused a second stick. Patient tolerated procedure well. Refused to wait 30 minute. Released stable and ASX. IV needle removed intact.

## 2021-08-26 ENCOUNTER — Inpatient Hospital Stay: Payer: Medicare Other | Attending: Hematology & Oncology

## 2021-08-26 VITALS — BP 108/63 | HR 55 | Temp 98.6°F | Resp 18

## 2021-08-26 DIAGNOSIS — D751 Secondary polycythemia: Secondary | ICD-10-CM | POA: Insufficient documentation

## 2021-08-26 NOTE — Progress Notes (Signed)
1 unit phlebotomy performed over 16 minutes using a 20 g IV angiocath to the left AC. Patient tolerated well. Nourishment provided. Patient does not want to stay for the recommended 30 minute post phlebotomy observation. Vital signs stable. Patient discharged ambulatory without complaints or concerns.

## 2021-08-26 NOTE — Patient Instructions (Signed)

## 2021-09-30 ENCOUNTER — Inpatient Hospital Stay: Payer: Medicare Other

## 2021-09-30 ENCOUNTER — Inpatient Hospital Stay: Payer: Medicare Other | Attending: Hematology & Oncology

## 2021-09-30 DIAGNOSIS — D751 Secondary polycythemia: Secondary | ICD-10-CM | POA: Diagnosis not present

## 2021-09-30 DIAGNOSIS — D500 Iron deficiency anemia secondary to blood loss (chronic): Secondary | ICD-10-CM

## 2021-09-30 LAB — CMP (CANCER CENTER ONLY)
ALT: 8 U/L (ref 0–44)
AST: 17 U/L (ref 15–41)
Albumin: 4.1 g/dL (ref 3.5–5.0)
Alkaline Phosphatase: 63 U/L (ref 38–126)
Anion gap: 6 (ref 5–15)
BUN: 11 mg/dL (ref 8–23)
CO2: 28 mmol/L (ref 22–32)
Calcium: 9.1 mg/dL (ref 8.9–10.3)
Chloride: 102 mmol/L (ref 98–111)
Creatinine: 0.65 mg/dL (ref 0.44–1.00)
GFR, Estimated: >60 mL/min (ref >60)
Glucose, Bld: 123 mg/dL — ABNORMAL HIGH (ref 70–99)
Potassium: 4.3 mmol/L (ref 3.5–5.1)
Sodium: 136 mmol/L (ref 135–145)
Total Bilirubin: 0.4 mg/dL (ref 0.3–1.2)
Total Protein: 6.7 g/dL (ref 6.5–8.1)

## 2021-09-30 LAB — IRON AND IRON BINDING CAPACITY (CC-WL,HP ONLY)
Iron: 23 ug/dL — ABNORMAL LOW (ref 28–170)
Saturation Ratios: 5 % — ABNORMAL LOW (ref 10.4–31.8)
TIBC: 491 ug/dL — ABNORMAL HIGH (ref 250–450)
UIBC: 468 ug/dL — ABNORMAL HIGH (ref 148–442)

## 2021-09-30 LAB — CBC WITH DIFFERENTIAL (CANCER CENTER ONLY)
Abs Immature Granulocytes: 0.01 10*3/uL (ref 0.00–0.07)
Basophils Absolute: 0.1 10*3/uL (ref 0.0–0.1)
Basophils Relative: 1 %
Eosinophils Absolute: 0.1 10*3/uL (ref 0.0–0.5)
Eosinophils Relative: 2 %
HCT: 39.5 % (ref 36.0–46.0)
Hemoglobin: 11.8 g/dL — ABNORMAL LOW (ref 12.0–15.0)
Immature Granulocytes: 0 %
Lymphocytes Relative: 35 %
Lymphs Abs: 2.1 10*3/uL (ref 0.7–4.0)
MCH: 26.3 pg (ref 26.0–34.0)
MCHC: 29.9 g/dL — ABNORMAL LOW (ref 30.0–36.0)
MCV: 88 fL (ref 80.0–100.0)
Monocytes Absolute: 0.7 10*3/uL (ref 0.1–1.0)
Monocytes Relative: 12 %
Neutro Abs: 3 10*3/uL (ref 1.7–7.7)
Neutrophils Relative %: 50 %
Platelet Count: 377 10*3/uL (ref 150–400)
RBC: 4.49 MIL/uL (ref 3.87–5.11)
RDW: 14.1 % (ref 11.5–15.5)
WBC Count: 6 10*3/uL (ref 4.0–10.5)
nRBC: 0 % (ref 0.0–0.2)

## 2021-09-30 LAB — FERRITIN: Ferritin: 5 ng/mL — ABNORMAL LOW (ref 11–307)

## 2021-09-30 NOTE — Progress Notes (Signed)
Based on today's blood test results patient does not need a phlebotomy today.

## 2021-10-04 DIAGNOSIS — F1120 Opioid dependence, uncomplicated: Secondary | ICD-10-CM | POA: Diagnosis not present

## 2021-10-04 DIAGNOSIS — G894 Chronic pain syndrome: Secondary | ICD-10-CM | POA: Diagnosis not present

## 2021-11-15 ENCOUNTER — Inpatient Hospital Stay: Payer: Medicare Other

## 2021-11-15 ENCOUNTER — Inpatient Hospital Stay: Payer: Medicare Other | Admitting: Family

## 2021-11-17 ENCOUNTER — Inpatient Hospital Stay: Payer: Medicare Other

## 2021-11-17 ENCOUNTER — Encounter: Payer: Self-pay | Admitting: Family

## 2021-11-17 ENCOUNTER — Inpatient Hospital Stay: Payer: Medicare Other | Attending: Hematology & Oncology | Admitting: Family

## 2021-11-17 VITALS — BP 145/57 | HR 62 | Temp 97.9°F | Resp 17 | Wt 147.0 lb

## 2021-11-17 VITALS — BP 127/68 | HR 63 | Resp 17

## 2021-11-17 DIAGNOSIS — D751 Secondary polycythemia: Secondary | ICD-10-CM | POA: Insufficient documentation

## 2021-11-17 DIAGNOSIS — D500 Iron deficiency anemia secondary to blood loss (chronic): Secondary | ICD-10-CM

## 2021-11-17 LAB — CMP (CANCER CENTER ONLY)
ALT: 10 U/L (ref 0–44)
AST: 18 U/L (ref 15–41)
Albumin: 4.4 g/dL (ref 3.5–5.0)
Alkaline Phosphatase: 67 U/L (ref 38–126)
Anion gap: 8 (ref 5–15)
BUN: 13 mg/dL (ref 8–23)
CO2: 30 mmol/L (ref 22–32)
Calcium: 9.7 mg/dL (ref 8.9–10.3)
Chloride: 100 mmol/L (ref 98–111)
Creatinine: 0.68 mg/dL (ref 0.44–1.00)
GFR, Estimated: >60 mL/min (ref >60)
Glucose, Bld: 127 mg/dL — ABNORMAL HIGH (ref 70–99)
Potassium: 4.3 mmol/L (ref 3.5–5.1)
Sodium: 138 mmol/L (ref 135–145)
Total Bilirubin: 0.3 mg/dL (ref 0.3–1.2)
Total Protein: 7.7 g/dL (ref 6.5–8.1)

## 2021-11-17 LAB — CBC WITH DIFFERENTIAL (CANCER CENTER ONLY)
Abs Immature Granulocytes: 0.02 10*3/uL (ref 0.00–0.07)
Basophils Absolute: 0 10*3/uL (ref 0.0–0.1)
Basophils Relative: 1 %
Eosinophils Absolute: 0.1 10*3/uL (ref 0.0–0.5)
Eosinophils Relative: 2 %
HCT: 47 % — ABNORMAL HIGH (ref 36.0–46.0)
Hemoglobin: 13.8 g/dL (ref 12.0–15.0)
Immature Granulocytes: 0 %
Lymphocytes Relative: 34 %
Lymphs Abs: 2.4 10*3/uL (ref 0.7–4.0)
MCH: 25.9 pg — ABNORMAL LOW (ref 26.0–34.0)
MCHC: 29.4 g/dL — ABNORMAL LOW (ref 30.0–36.0)
MCV: 88.2 fL (ref 80.0–100.0)
Monocytes Absolute: 0.8 10*3/uL (ref 0.1–1.0)
Monocytes Relative: 12 %
Neutro Abs: 3.7 10*3/uL (ref 1.7–7.7)
Neutrophils Relative %: 51 %
Platelet Count: 314 10*3/uL (ref 150–400)
RBC: 5.33 MIL/uL — ABNORMAL HIGH (ref 3.87–5.11)
RDW: 15.9 % — ABNORMAL HIGH (ref 11.5–15.5)
WBC Count: 7.1 10*3/uL (ref 4.0–10.5)
nRBC: 0 % (ref 0.0–0.2)

## 2021-11-17 LAB — FERRITIN: Ferritin: 6 ng/mL — ABNORMAL LOW (ref 11–307)

## 2021-11-17 NOTE — Patient Instructions (Signed)

## 2021-11-17 NOTE — Progress Notes (Signed)
Hematology and Oncology Follow Up Visit  Abigail Mullins [id number] [birth year] 71 y.o. 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 is here today for follow-up and phlebotomy. Hct is 47%.  She has chronic back pain that effects her mobility.  No falls or syncope reported.  No swelling in her extremities.  Neuropathy in her hands and feet unchanged.  No fever, chills, n/v, cough, rash, dizziness, SOB, chest pain, palpitations, abdominal pain or changes in bowel or bladder habits.  Appetite and hydration are good. Her weight is stable at 147 lbs.   ECOG Performance Status: 1 - Symptomatic but completely ambulatory  Medications:  Allergies as of 11/17/2021   No Known Allergies      Medication List        Accurate as of November 17, 2021  1:14 PM. If you have any questions, ask your nurse or doctor.          amLODipine 5 MG tablet Commonly known as: NORVASC Take 5 mg by mouth daily.   fentaNYL 50 MCG/HR Commonly known as: DURAGESIC Place 1 patch onto the skin 3 times/day as needed-between meals & bedtime.   fexofenadine 60 MG tablet Commonly known as: ALLEGRA Take 60 mg by mouth as needed.   ibuprofen 800 MG tablet Commonly known as: ADVIL Take 800 mg by mouth as needed.   metoprolol succinate 25 MG 24 hr tablet Commonly known as: TOPROL-XL Take 25 mg by mouth daily.   naproxen 375 MG tablet Commonly known as: NAPROSYN Take 1 tablet by mouth every 12 (twelve) hours.   oxyCODONE-acetaminophen 5-325 MG tablet Commonly known as: PERCOCET/ROXICET Take 1 tablet by mouth as needed.   polyethylene glycol 17 g packet Commonly known as: MIRALAX / GLYCOLAX Mix 1 capful in 8 ounces of fluid   SYSTANE ULTRA PF OP Apply to eye as needed.        Allergies: No Known Allergies  Past  Medical History, Surgical history, Social history, and Family History were reviewed and updated.  Review of Systems: All other 10 point review of systems is negative.   Physical Exam:  vitals were not taken for this visit.   Wt Readings from Last 3 Encounters:  08/19/21 145 lb 1.3 oz (65.8 kg)  04/22/21 150 lb (68 kg)  01/21/21 148 lb (67.1 kg)    Ocular: Sclerae unicteric, pupils equal, round and reactive to light Ear-nose-throat: Oropharynx clear, dentition fair Lymphatic: No cervical or supraclavicular adenopathy Lungs no rales or rhonchi, good excursion bilaterally Heart regular rate and rhythm, no murmur appreciated Abd soft, nontender, positive bowel sounds MSK no focal spinal tenderness, no joint edema Neuro: non-focal, well-oriented, appropriate affect Breasts: Deferred   Lab Results  Component Value Date   WBC 7.1 11/17/2021   HGB 13.8 11/17/2021   HCT 47.0 (H) 11/17/2021   MCV 88.2 11/17/2021   PLT 314 11/17/2021   Lab Results  Component Value Date   FERRITIN 5 (L) 09/30/2021   IRON 23 (L) 09/30/2021   TIBC 491 (H) 09/30/2021   UIBC 468 (H) 09/30/2021   IRONPCTSAT 5 (L) 09/30/2021   Lab Results  Component Value Date   RETICCTPCT 4.4 (H) 08/24/2020   RBC 5.33 (H) 11/17/2021   No results found for: "KPAFRELGTCHN", "LAMBDASER", "KAPLAMBRATIO" No results found for: "IGGSERUM", "IGA", "  IGMSERUM" No results found for: "TOTALPROTELP", "ALBUMINELP", "A1GS", "A2GS", "BETS", "BETA2SER", "GAMS", "MSPIKE", "SPEI"   Chemistry      Component Value Date/Time   NA 136 09/30/2021 1132   K 4.3 09/30/2021 1132   CL 102 09/30/2021 1132   CO2 28 09/30/2021 1132   BUN 11 09/30/2021 1132   CREATININE 0.65 09/30/2021 1132      Component Value Date/Time   CALCIUM 9.1 09/30/2021 1132   ALKPHOS 63 09/30/2021 1132   AST 17 09/30/2021 1132   ALT 8 09/30/2021 1132   BILITOT 0.4 09/30/2021 1132       Impression and Plan:  [name] is a very pleasant 71 yo caucasian  female with polycythemia, secondary. JAK 2 and hemochromatosis DNA were both negative. Phlebotomy today for 47%.  Lab check every 6 weeks and follow-up in 3 months.   Eileen Stanford, NP 8/24/20231:14 PM

## 2021-11-17 NOTE — Progress Notes (Signed)
Abigail Mullins presents today for phlebotomy per MD orders. Phlebotomy procedure started at 1400 and ended at 1422.  537 grams removed via 20 gauge needle to left AC. Patient declined to stay for post procedure  observation time. Pt states she has tolerated procedure multiple times prior without difficulty. Pt given snacks and drink during procedure. Patient tolerated procedure well. IV needle removed intact.

## 2021-11-18 LAB — IRON AND IRON BINDING CAPACITY (CC-WL,HP ONLY)
Iron: 32 ug/dL (ref 28–170)
Saturation Ratios: 6 % — ABNORMAL LOW (ref 10.4–31.8)
TIBC: 519 ug/dL — ABNORMAL HIGH (ref 250–450)
UIBC: 487 ug/dL — ABNORMAL HIGH (ref 148–442)

## 2021-12-27 DIAGNOSIS — G894 Chronic pain syndrome: Secondary | ICD-10-CM | POA: Diagnosis not present

## 2021-12-27 DIAGNOSIS — Z9889 Other specified postprocedural states: Secondary | ICD-10-CM | POA: Diagnosis not present

## 2021-12-27 DIAGNOSIS — F1120 Opioid dependence, uncomplicated: Secondary | ICD-10-CM | POA: Diagnosis not present

## 2021-12-28 ENCOUNTER — Inpatient Hospital Stay: Payer: Medicare Other

## 2021-12-28 ENCOUNTER — Telehealth: Payer: Self-pay

## 2021-12-28 ENCOUNTER — Inpatient Hospital Stay: Payer: Medicare Other | Attending: Hematology & Oncology

## 2021-12-28 DIAGNOSIS — D751 Secondary polycythemia: Secondary | ICD-10-CM | POA: Diagnosis not present

## 2021-12-28 LAB — CMP (CANCER CENTER ONLY)
ALT: 11 U/L (ref 0–44)
AST: 20 U/L (ref 15–41)
Albumin: 4.3 g/dL (ref 3.5–5.0)
Alkaline Phosphatase: 59 U/L (ref 38–126)
Anion gap: 7 (ref 5–15)
BUN: 7 mg/dL — ABNORMAL LOW (ref 8–23)
CO2: 29 mmol/L (ref 22–32)
Calcium: 9.6 mg/dL (ref 8.9–10.3)
Chloride: 100 mmol/L (ref 98–111)
Creatinine: 0.66 mg/dL (ref 0.44–1.00)
GFR, Estimated: >60 mL/min (ref >60)
Glucose, Bld: 128 mg/dL — ABNORMAL HIGH (ref 70–99)
Potassium: 4.4 mmol/L (ref 3.5–5.1)
Sodium: 136 mmol/L (ref 135–145)
Total Bilirubin: 0.5 mg/dL (ref 0.3–1.2)
Total Protein: 7.3 g/dL (ref 6.5–8.1)

## 2021-12-28 LAB — CBC WITH DIFFERENTIAL (CANCER CENTER ONLY)
Abs Immature Granulocytes: 0.02 10*3/uL (ref 0.00–0.07)
Basophils Absolute: 0 10*3/uL (ref 0.0–0.1)
Basophils Relative: 1 %
Eosinophils Absolute: 0.1 10*3/uL (ref 0.0–0.5)
Eosinophils Relative: 1 %
HCT: 44.4 % (ref 36.0–46.0)
Hemoglobin: 13.2 g/dL (ref 12.0–15.0)
Immature Granulocytes: 0 %
Lymphocytes Relative: 23 %
Lymphs Abs: 2 10*3/uL (ref 0.7–4.0)
MCH: 25.8 pg — ABNORMAL LOW (ref 26.0–34.0)
MCHC: 29.7 g/dL — ABNORMAL LOW (ref 30.0–36.0)
MCV: 86.7 fL (ref 80.0–100.0)
Monocytes Absolute: 0.8 10*3/uL (ref 0.1–1.0)
Monocytes Relative: 9 %
Neutro Abs: 5.9 10*3/uL (ref 1.7–7.7)
Neutrophils Relative %: 66 %
Platelet Count: 272 10*3/uL (ref 150–400)
RBC: 5.12 MIL/uL — ABNORMAL HIGH (ref 3.87–5.11)
RDW: 15 % (ref 11.5–15.5)
WBC Count: 8.8 10*3/uL (ref 4.0–10.5)
nRBC: 0 % (ref 0.0–0.2)

## 2021-12-28 NOTE — Telephone Encounter (Signed)
Pt did not meet lab parameters for phlebotomy. Pt given a copy of labs and aware no need for phlebotomy. Pt verbalized understanding and happy to not have to have phlebotomy. PT aware of next appointment. Pt left lobby in no distress.

## 2022-01-30 ENCOUNTER — Telehealth: Payer: Self-pay | Admitting: *Deleted

## 2022-01-30 NOTE — Telephone Encounter (Signed)
Called patient and lvm about rescheduling appointment with sarah - requested callback to confirm. 

## 2022-02-01 DIAGNOSIS — D751 Secondary polycythemia: Secondary | ICD-10-CM | POA: Diagnosis not present

## 2022-02-01 DIAGNOSIS — Z9889 Other specified postprocedural states: Secondary | ICD-10-CM | POA: Diagnosis not present

## 2022-02-01 DIAGNOSIS — I10 Essential (primary) hypertension: Secondary | ICD-10-CM | POA: Diagnosis not present

## 2022-02-01 DIAGNOSIS — F1120 Opioid dependence, uncomplicated: Secondary | ICD-10-CM | POA: Diagnosis not present

## 2022-02-01 DIAGNOSIS — Z1211 Encounter for screening for malignant neoplasm of colon: Secondary | ICD-10-CM | POA: Diagnosis not present

## 2022-02-01 DIAGNOSIS — G894 Chronic pain syndrome: Secondary | ICD-10-CM | POA: Diagnosis not present

## 2022-02-01 DIAGNOSIS — Z0000 Encounter for general adult medical examination without abnormal findings: Secondary | ICD-10-CM | POA: Diagnosis not present

## 2022-02-02 ENCOUNTER — Other Ambulatory Visit: Payer: Self-pay | Admitting: Family Medicine

## 2022-02-02 DIAGNOSIS — Z1231 Encounter for screening mammogram for malignant neoplasm of breast: Secondary | ICD-10-CM

## 2022-02-08 ENCOUNTER — Inpatient Hospital Stay: Payer: Medicare Other

## 2022-02-08 ENCOUNTER — Inpatient Hospital Stay: Payer: Medicare Other | Admitting: Family

## 2022-02-10 ENCOUNTER — Inpatient Hospital Stay: Payer: Medicare Other

## 2022-02-10 ENCOUNTER — Inpatient Hospital Stay: Payer: Medicare Other | Admitting: Family

## 2022-02-13 ENCOUNTER — Inpatient Hospital Stay: Payer: Medicare Other | Attending: Hematology & Oncology

## 2022-02-13 ENCOUNTER — Inpatient Hospital Stay (HOSPITAL_BASED_OUTPATIENT_CLINIC_OR_DEPARTMENT_OTHER): Payer: Medicare Other | Admitting: Family

## 2022-02-13 ENCOUNTER — Encounter: Payer: Self-pay | Admitting: Family

## 2022-02-13 ENCOUNTER — Other Ambulatory Visit: Payer: Self-pay

## 2022-02-13 ENCOUNTER — Inpatient Hospital Stay: Payer: Medicare Other

## 2022-02-13 VITALS — BP 126/69 | HR 73 | Temp 97.8°F | Resp 19 | Ht 67.0 in | Wt 148.0 lb

## 2022-02-13 DIAGNOSIS — R200 Anesthesia of skin: Secondary | ICD-10-CM | POA: Insufficient documentation

## 2022-02-13 DIAGNOSIS — D751 Secondary polycythemia: Secondary | ICD-10-CM | POA: Diagnosis not present

## 2022-02-13 DIAGNOSIS — R202 Paresthesia of skin: Secondary | ICD-10-CM | POA: Diagnosis not present

## 2022-02-13 DIAGNOSIS — D500 Iron deficiency anemia secondary to blood loss (chronic): Secondary | ICD-10-CM

## 2022-02-13 LAB — CBC WITH DIFFERENTIAL (CANCER CENTER ONLY)
Abs Immature Granulocytes: 0.03 10*3/uL (ref 0.00–0.07)
Basophils Absolute: 0 10*3/uL (ref 0.0–0.1)
Basophils Relative: 0 %
Eosinophils Absolute: 0.1 10*3/uL (ref 0.0–0.5)
Eosinophils Relative: 1 %
HCT: 46.4 % — ABNORMAL HIGH (ref 36.0–46.0)
Hemoglobin: 13.9 g/dL (ref 12.0–15.0)
Immature Granulocytes: 0 %
Lymphocytes Relative: 21 %
Lymphs Abs: 2.2 10*3/uL (ref 0.7–4.0)
MCH: 26.5 pg (ref 26.0–34.0)
MCHC: 30 g/dL (ref 30.0–36.0)
MCV: 88.4 fL (ref 80.0–100.0)
Monocytes Absolute: 0.7 10*3/uL (ref 0.1–1.0)
Monocytes Relative: 7 %
Neutro Abs: 7.1 10*3/uL (ref 1.7–7.7)
Neutrophils Relative %: 71 %
Platelet Count: 334 10*3/uL (ref 150–400)
RBC: 5.25 MIL/uL — ABNORMAL HIGH (ref 3.87–5.11)
RDW: 14.9 % (ref 11.5–15.5)
WBC Count: 10.1 10*3/uL (ref 4.0–10.5)
nRBC: 0 % (ref 0.0–0.2)

## 2022-02-13 LAB — CMP (CANCER CENTER ONLY)
ALT: 16 U/L (ref 0–44)
AST: 19 U/L (ref 15–41)
Albumin: 4.5 g/dL (ref 3.5–5.0)
Alkaline Phosphatase: 65 U/L (ref 38–126)
Anion gap: 9 (ref 5–15)
BUN: 12 mg/dL (ref 8–23)
CO2: 29 mmol/L (ref 22–32)
Calcium: 10.1 mg/dL (ref 8.9–10.3)
Chloride: 100 mmol/L (ref 98–111)
Creatinine: 0.71 mg/dL (ref 0.44–1.00)
GFR, Estimated: >60 mL/min (ref >60)
Glucose, Bld: 138 mg/dL — ABNORMAL HIGH (ref 70–99)
Potassium: 5.1 mmol/L (ref 3.5–5.1)
Sodium: 138 mmol/L (ref 135–145)
Total Bilirubin: 0.3 mg/dL (ref 0.3–1.2)
Total Protein: 7.7 g/dL (ref 6.5–8.1)

## 2022-02-13 LAB — IRON AND IRON BINDING CAPACITY (CC-WL,HP ONLY)
Iron: 71 ug/dL (ref 28–170)
Saturation Ratios: 14 % (ref 10.4–31.8)
TIBC: 508 ug/dL — ABNORMAL HIGH (ref 250–450)
UIBC: 437 ug/dL (ref 148–442)

## 2022-02-13 NOTE — Progress Notes (Signed)
Unable to get blood return from stick to right ac with 20 ga angio-cath and second stick with 20 ga angio cath only able to get 55 gms.  Per Celene Skeen NP, no need to attempt 3rd stick and pt can return for next scheduled appt with Celene Skeen NP.  Pt is appreciative of that plan and is without complaints at time of discharge.

## 2022-02-13 NOTE — Progress Notes (Signed)
Hematology and Oncology Follow Up Visit  Abigail Mullins [id number] 1950-09-16 71 y.o. 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 is here today for follow-up. She is doing fairly well. She has come off of the Fentanyl patch and is currently on Xtampza and percocet which she does not feel is working well to control her pain.  Numbness and tingling in her hands and feet unchanged from baseline.  No falls or syncope reported.  No fever, chills, n/v, cough, rash, dizziness, SOB, chest pain, palpitations, abdominal pain or changes in bowel or bladder habits.  No bruising or petechiae.  Appetite and hydration are good. Weight is stable at 148 lbs.   ECOG Performance Status: 1 - Symptomatic but completely ambulatory  Medications:  Allergies as of 02/13/2022   No Known Allergies      Medication List        Accurate as of February 13, 2022  1:14 PM. If you have any questions, ask your nurse or doctor.          STOP taking these medications    fentaNYL 50 MCG/HR Commonly known as: DURAGESIC Stopped by: Eileen Stanford, NP   naproxen 375 MG tablet Commonly known as: NAPROSYN Stopped by: Eileen Stanford, NP       TAKE these medications    amLODipine 5 MG tablet Commonly known as: NORVASC Take 5 mg by mouth daily.   fexofenadine 60 MG tablet Commonly known as: ALLEGRA Take 60 mg by mouth as needed.   ibuprofen 800 MG tablet Commonly known as: ADVIL Take 800 mg by mouth as needed.   metoprolol succinate 25 MG 24 hr tablet Commonly known as: TOPROL-XL Take 25 mg by mouth daily.   oxyCODONE-acetaminophen 10-325 MG tablet Commonly known as: PERCOCET Take 1 tablet by mouth every 6 (six) hours as needed for pain. What changed: Another medication with the same name was removed. Continue  taking this medication, and follow the directions you see here. Changed by: Eileen Stanford, NP   polyethylene glycol 17 g packet Commonly known as: MIRALAX / GLYCOLAX Mix 1 capful in 8 ounces of fluid   SYSTANE ULTRA PF OP Apply to eye as needed.        Allergies: No Known Allergies  Past Medical History, Surgical history, Social history, and Family History were reviewed and updated.  Review of Systems: All other 10 point review of systems is negative.   Physical Exam:  height is 5\' 7"  (1.702 m) and weight is 148 lb (67.1 kg).   Wt Readings from Last 3 Encounters:  02/13/22 148 lb (67.1 kg)  11/17/21 147 lb (66.7 kg)  08/19/21 145 lb 1.3 oz (65.8 kg)    Ocular: Sclerae unicteric, pupils equal, round and reactive to light Ear-nose-throat: Oropharynx clear, dentition fair Lymphatic: No cervical or supraclavicular adenopathy Lungs no rales or rhonchi, good excursion bilaterally Heart regular rate and rhythm, no murmur appreciated Abd soft, nontender, positive bowel sounds MSK no focal spinal tenderness, no joint edema Neuro: non-focal, well-oriented, appropriate affect Breasts: Deferred   Lab Results  Component Value Date   WBC 10.1 02/13/2022   HGB 13.9 02/13/2022   HCT 46.4 (H) 02/13/2022   MCV 88.4 02/13/2022   PLT 334 02/13/2022   Lab Results  Component Value Date   FERRITIN  6 (L) 11/17/2021   IRON 32 11/17/2021   TIBC 519 (H) 11/17/2021   UIBC 487 (H) 11/17/2021   IRONPCTSAT 6 (L) 11/17/2021   Lab Results  Component Value Date   RETICCTPCT 4.4 (H) 08/24/2020   RBC 5.25 (H) 02/13/2022   No results found for: "KPAFRELGTCHN", "LAMBDASER", "KAPLAMBRATIO" No results found for: "IGGSERUM", "IGA", "IGMSERUM" No results found for: "TOTALPROTELP", "ALBUMINELP", "A1GS", "A2GS", "BETS", "BETA2SER", "GAMS", "MSPIKE", "SPEI"   Chemistry      Component Value Date/Time   NA 136 12/28/2021 1325   K 4.4 12/28/2021 1325   CL 100 12/28/2021 1325   CO2 29 12/28/2021  1325   BUN 7 (L) 12/28/2021 1325   CREATININE 0.66 12/28/2021 1325      Component Value Date/Time   CALCIUM 9.6 12/28/2021 1325   ALKPHOS 59 12/28/2021 1325   AST 20 12/28/2021 1325   ALT 11 12/28/2021 1325   BILITOT 0.5 12/28/2021 1325       Impression and Plan: [name] is a very pleasant 71 yo caucasian female with polycythemia, secondary. JAK 2 and hemochromatosis DNA were both negative. Phlebotomy today for Hct 46%.  Follow-up 3 months.  Eileen Stanford, NP 11/20/20231:14 PM

## 2022-02-14 LAB — FERRITIN: Ferritin: 7 ng/mL — ABNORMAL LOW (ref 11–307)

## 2022-04-06 DIAGNOSIS — F1120 Opioid dependence, uncomplicated: Secondary | ICD-10-CM | POA: Diagnosis not present

## 2022-04-06 DIAGNOSIS — G894 Chronic pain syndrome: Secondary | ICD-10-CM | POA: Diagnosis not present

## 2022-04-19 ENCOUNTER — Encounter: Payer: Self-pay | Admitting: Family

## 2022-04-26 ENCOUNTER — Ambulatory Visit: Payer: Medicare Other

## 2022-05-15 ENCOUNTER — Inpatient Hospital Stay: Payer: Medicare Other | Admitting: Family

## 2022-05-15 ENCOUNTER — Inpatient Hospital Stay: Payer: Medicare Other

## 2022-05-17 ENCOUNTER — Encounter: Payer: Self-pay | Admitting: Family

## 2022-05-17 ENCOUNTER — Inpatient Hospital Stay: Payer: Medicare Other

## 2022-05-17 ENCOUNTER — Inpatient Hospital Stay (HOSPITAL_BASED_OUTPATIENT_CLINIC_OR_DEPARTMENT_OTHER): Payer: Medicare Other | Admitting: Family

## 2022-05-17 ENCOUNTER — Inpatient Hospital Stay: Payer: Medicare Other | Attending: Hematology & Oncology

## 2022-05-17 VITALS — BP 162/85 | HR 60 | Temp 98.2°F | Resp 18 | Wt 150.4 lb

## 2022-05-17 DIAGNOSIS — D751 Secondary polycythemia: Secondary | ICD-10-CM

## 2022-05-17 DIAGNOSIS — M549 Dorsalgia, unspecified: Secondary | ICD-10-CM | POA: Insufficient documentation

## 2022-05-17 DIAGNOSIS — D500 Iron deficiency anemia secondary to blood loss (chronic): Secondary | ICD-10-CM | POA: Diagnosis not present

## 2022-05-17 DIAGNOSIS — R200 Anesthesia of skin: Secondary | ICD-10-CM | POA: Insufficient documentation

## 2022-05-17 DIAGNOSIS — R202 Paresthesia of skin: Secondary | ICD-10-CM | POA: Insufficient documentation

## 2022-05-17 DIAGNOSIS — G8929 Other chronic pain: Secondary | ICD-10-CM | POA: Diagnosis not present

## 2022-05-17 LAB — CBC WITH DIFFERENTIAL (CANCER CENTER ONLY)
Abs Immature Granulocytes: 0.02 10*3/uL (ref 0.00–0.07)
Basophils Absolute: 0 10*3/uL (ref 0.0–0.1)
Basophils Relative: 0 %
Eosinophils Absolute: 0.1 10*3/uL (ref 0.0–0.5)
Eosinophils Relative: 1 %
HCT: 46.3 % — ABNORMAL HIGH (ref 36.0–46.0)
Hemoglobin: 13.9 g/dL (ref 12.0–15.0)
Immature Granulocytes: 0 %
Lymphocytes Relative: 29 %
Lymphs Abs: 2.2 10*3/uL (ref 0.7–4.0)
MCH: 28 pg (ref 26.0–34.0)
MCHC: 30 g/dL (ref 30.0–36.0)
MCV: 93.2 fL (ref 80.0–100.0)
Monocytes Absolute: 0.8 10*3/uL (ref 0.1–1.0)
Monocytes Relative: 10 %
Neutro Abs: 4.5 10*3/uL (ref 1.7–7.7)
Neutrophils Relative %: 60 %
Platelet Count: 293 10*3/uL (ref 150–400)
RBC: 4.97 MIL/uL (ref 3.87–5.11)
RDW: 14.9 % (ref 11.5–15.5)
WBC Count: 7.6 10*3/uL (ref 4.0–10.5)
nRBC: 0 % (ref 0.0–0.2)

## 2022-05-17 LAB — CMP (CANCER CENTER ONLY)
ALT: 9 U/L (ref 0–44)
AST: 17 U/L (ref 15–41)
Albumin: 4.5 g/dL (ref 3.5–5.0)
Alkaline Phosphatase: 67 U/L (ref 38–126)
Anion gap: 8 (ref 5–15)
BUN: 9 mg/dL (ref 8–23)
CO2: 31 mmol/L (ref 22–32)
Calcium: 9.8 mg/dL (ref 8.9–10.3)
Chloride: 102 mmol/L (ref 98–111)
Creatinine: 0.74 mg/dL (ref 0.44–1.00)
GFR, Estimated: >60 mL/min (ref >60)
Glucose, Bld: 152 mg/dL — ABNORMAL HIGH (ref 70–99)
Potassium: 5 mmol/L (ref 3.5–5.1)
Sodium: 141 mmol/L (ref 135–145)
Total Bilirubin: 0.4 mg/dL (ref 0.3–1.2)
Total Protein: 7.4 g/dL (ref 6.5–8.1)

## 2022-05-17 LAB — FERRITIN: Ferritin: 5 ng/mL — ABNORMAL LOW (ref 11–307)

## 2022-05-17 NOTE — Patient Instructions (Signed)

## 2022-05-17 NOTE — Progress Notes (Signed)
Denton Lank presents today for phlebotomy per MD orders. Phlebotomy procedure started at 1430 with 20 g angiocath and ended at 1450. 545 grams removed without incident.   Patient did not want to stay for observation although I urged her to. . Patient tolerated procedure well. IV needle removed intact.

## 2022-05-17 NOTE — Progress Notes (Signed)
Hematology and Oncology Follow Up Visit  Abigail Mullins [id number] June 06, 1950 72 y.o. 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 She is doing well. Her only complaint at this time is the chronic back pain. This is unchanged from baseline.  No bruising or petechiae.  No fever, chills, n/v, cough, rash, dizziness, SOB, chest pain, palpitations, abdominal pain or changes in bowel or bladder habits.   No swelling in her extremities.  Numbness and tingling in her hands and feet waxes and wanes.  No falls or syncope.  Appetite and hydration are good. Weight is stable at 150 lbs.   ECOG Performance Status: 1 - Symptomatic but completely ambulatory  Medications:  Allergies as of 05/17/2022   No Known Allergies      Medication List        Accurate as of May 17, 2022  1:53 PM. If you have any questions, ask your nurse or doctor.          amLODipine 5 MG tablet Commonly known as: NORVASC Take 5 mg by mouth daily.   fexofenadine 60 MG tablet Commonly known as: ALLEGRA Take 60 mg by mouth as needed.   ibuprofen 800 MG tablet Commonly known as: ADVIL Take 800 mg by mouth as needed.   metoprolol succinate 25 MG 24 hr tablet Commonly known as: TOPROL-XL Take 25 mg by mouth daily.   oxyCODONE-acetaminophen 10-325 MG tablet Commonly known as: PERCOCET Take 1 tablet by mouth every 6 (six) hours as needed for pain.   polyethylene glycol 17 g packet Commonly known as: MIRALAX / GLYCOLAX Mix 1 capful in 8 ounces of fluid   SYSTANE ULTRA PF OP Apply to eye as needed.        Allergies: No Known Allergies  Past Medical History, Surgical history, Social history, and Family History were reviewed and updated.  Review of Systems: All other 10 point review of  systems is negative.   Physical Exam:  vitals were not taken for this visit.   Wt Readings from Last 3 Encounters:  02/13/22 148 lb (67.1 kg)  11/17/21 147 lb (66.7 kg)  08/19/21 145 lb 1.3 oz (65.8 kg)    Ocular: Sclerae unicteric, pupils equal, round and reactive to light Ear-nose-throat: Oropharynx clear, dentition fair Lymphatic: No cervical or supraclavicular adenopathy Lungs no rales or rhonchi, good excursion bilaterally Heart regular rate and rhythm, no murmur appreciated Abd soft, nontender, positive bowel sounds MSK no focal spinal tenderness, no joint edema Neuro: non-focal, well-oriented, appropriate affect Breasts: Deferred   Lab Results  Component Value Date   WBC 7.6 05/17/2022   HGB 13.9 05/17/2022   HCT 46.3 (H) 05/17/2022   MCV 93.2 05/17/2022   PLT 293 05/17/2022   Lab Results  Component Value Date   FERRITIN 7 (L) 02/13/2022   IRON 71 02/13/2022   TIBC 508 (H) 02/13/2022   UIBC 437 02/13/2022   IRONPCTSAT 14 02/13/2022   Lab Results  Component Value Date   RETICCTPCT 4.4 (H) 08/24/2020   RBC 4.97 05/17/2022   No results found for: "KPAFRELGTCHN", "LAMBDASER", "KAPLAMBRATIO" No results found for: "IGGSERUM", "IGA", "IGMSERUM" No results found for: "TOTALPROTELP", "ALBUMINELP", "A1GS", "A2GS", "BETS", "BETA2SER", "GAMS", "MSPIKE", "SPEI"   Chemistry      Component Value Date/Time  NA 138 02/13/2022 1246   K 5.1 02/13/2022 1246   CL 100 02/13/2022 1246   CO2 29 02/13/2022 1246   BUN 12 02/13/2022 1246   CREATININE 0.71 02/13/2022 1246      Component Value Date/Time   CALCIUM 10.1 02/13/2022 1246   ALKPHOS 65 02/13/2022 1246   AST 19 02/13/2022 1246   ALT 16 02/13/2022 1246   BILITOT 0.3 02/13/2022 1246       Impression and Plan: Abigail Mullins is a very pleasant 72 yo caucasian female with polycythemia, secondary. JAK 2 and hemochromatosis DNA were both negative. Phlebotomy today for Hct 46.3%.  Follow-up 3 months.  Lottie Dawson,  NP 2/21/20241:53 PM

## 2022-05-18 LAB — IRON AND IRON BINDING CAPACITY (CC-WL,HP ONLY)
Iron: 23 ug/dL — ABNORMAL LOW (ref 28–170)
Saturation Ratios: 5 % — ABNORMAL LOW (ref 10.4–31.8)
TIBC: 489 ug/dL — ABNORMAL HIGH (ref 250–450)
UIBC: 466 ug/dL — ABNORMAL HIGH (ref 148–442)

## 2022-07-04 DIAGNOSIS — M5416 Radiculopathy, lumbar region: Secondary | ICD-10-CM | POA: Diagnosis not present

## 2022-07-04 DIAGNOSIS — G894 Chronic pain syndrome: Secondary | ICD-10-CM | POA: Diagnosis not present

## 2022-07-04 DIAGNOSIS — F1120 Opioid dependence, uncomplicated: Secondary | ICD-10-CM | POA: Diagnosis not present

## 2022-08-15 ENCOUNTER — Inpatient Hospital Stay: Payer: Medicare Other

## 2022-08-24 ENCOUNTER — Inpatient Hospital Stay: Payer: Medicare Other

## 2022-08-24 ENCOUNTER — Inpatient Hospital Stay: Payer: Medicare Other | Attending: Hematology & Oncology

## 2022-08-24 DIAGNOSIS — D500 Iron deficiency anemia secondary to blood loss (chronic): Secondary | ICD-10-CM

## 2022-08-24 DIAGNOSIS — D751 Secondary polycythemia: Secondary | ICD-10-CM | POA: Diagnosis not present

## 2022-08-24 DIAGNOSIS — D509 Iron deficiency anemia, unspecified: Secondary | ICD-10-CM | POA: Diagnosis not present

## 2022-08-24 LAB — CBC WITH DIFFERENTIAL (CANCER CENTER ONLY)
Abs Immature Granulocytes: 0.03 10*3/uL (ref 0.00–0.07)
Basophils Absolute: 0 10*3/uL (ref 0.0–0.1)
Basophils Relative: 0 %
Eosinophils Absolute: 0 10*3/uL (ref 0.0–0.5)
Eosinophils Relative: 0 %
HCT: 45.7 % (ref 36.0–46.0)
Hemoglobin: 13.8 g/dL (ref 12.0–15.0)
Immature Granulocytes: 0 %
Lymphocytes Relative: 20 %
Lymphs Abs: 1.8 10*3/uL (ref 0.7–4.0)
MCH: 26.4 pg (ref 26.0–34.0)
MCHC: 30.2 g/dL (ref 30.0–36.0)
MCV: 87.5 fL (ref 80.0–100.0)
Monocytes Absolute: 0.8 10*3/uL (ref 0.1–1.0)
Monocytes Relative: 8 %
Neutro Abs: 6.5 10*3/uL (ref 1.7–7.7)
Neutrophils Relative %: 72 %
Platelet Count: 292 10*3/uL (ref 150–400)
RBC: 5.22 MIL/uL — ABNORMAL HIGH (ref 3.87–5.11)
RDW: 15.4 % (ref 11.5–15.5)
WBC Count: 9.1 10*3/uL (ref 4.0–10.5)
nRBC: 0 % (ref 0.0–0.2)

## 2022-08-24 LAB — CMP (CANCER CENTER ONLY)
ALT: 9 U/L (ref 0–44)
AST: 19 U/L (ref 15–41)
Albumin: 4.4 g/dL (ref 3.5–5.0)
Alkaline Phosphatase: 69 U/L (ref 38–126)
Anion gap: 7 (ref 5–15)
BUN: 12 mg/dL (ref 8–23)
CO2: 30 mmol/L (ref 22–32)
Calcium: 10 mg/dL (ref 8.9–10.3)
Chloride: 103 mmol/L (ref 98–111)
Creatinine: 0.68 mg/dL (ref 0.44–1.00)
GFR, Estimated: >60 mL/min (ref >60)
Glucose, Bld: 143 mg/dL — ABNORMAL HIGH (ref 70–99)
Potassium: 6.2 mmol/L — ABNORMAL HIGH (ref 3.5–5.1)
Sodium: 140 mmol/L (ref 135–145)
Total Bilirubin: 0.4 mg/dL (ref 0.3–1.2)
Total Protein: 7.6 g/dL (ref 6.5–8.1)

## 2022-08-24 LAB — IRON AND IRON BINDING CAPACITY (CC-WL,HP ONLY)
Iron: 50 ug/dL (ref 28–170)
Saturation Ratios: 9 % — ABNORMAL LOW (ref 10.4–31.8)
TIBC: 529 ug/dL — ABNORMAL HIGH (ref 250–450)
UIBC: 479 ug/dL — ABNORMAL HIGH (ref 148–442)

## 2022-08-24 LAB — FERRITIN: Ferritin: 5 ng/mL — ABNORMAL LOW (ref 11–307)

## 2022-08-24 NOTE — Progress Notes (Signed)
Reviewed CBC results with patient who stated that she did not want a phlebotomy with HCT 45.6 Pt states she doesn't notice a difference in how she feels so would like to wait. Appointments made to re-check labs and phlebotomy for one month. Pt agreeable to plan and happy stating "I really did not want a phlebotomy"  Pt left ambulatory in no apparent distress.

## 2022-09-22 ENCOUNTER — Inpatient Hospital Stay: Payer: Medicare Other

## 2022-09-22 ENCOUNTER — Inpatient Hospital Stay: Payer: Medicare Other | Attending: Hematology & Oncology

## 2022-09-22 VITALS — BP 156/70 | HR 67 | Temp 97.7°F | Resp 18

## 2022-09-22 DIAGNOSIS — D751 Secondary polycythemia: Secondary | ICD-10-CM | POA: Diagnosis not present

## 2022-09-22 DIAGNOSIS — D509 Iron deficiency anemia, unspecified: Secondary | ICD-10-CM | POA: Insufficient documentation

## 2022-09-22 DIAGNOSIS — D500 Iron deficiency anemia secondary to blood loss (chronic): Secondary | ICD-10-CM

## 2022-09-22 LAB — IRON AND IRON BINDING CAPACITY (CC-WL,HP ONLY)
Iron: 41 ug/dL (ref 28–170)
Saturation Ratios: 8 % — ABNORMAL LOW (ref 10.4–31.8)
TIBC: 535 ug/dL — ABNORMAL HIGH (ref 250–450)
UIBC: 494 ug/dL — ABNORMAL HIGH (ref 148–442)

## 2022-09-22 LAB — CMP (CANCER CENTER ONLY)
ALT: 10 U/L (ref 0–44)
AST: 17 U/L (ref 15–41)
Albumin: 4.6 g/dL (ref 3.5–5.0)
Alkaline Phosphatase: 81 U/L (ref 38–126)
Anion gap: 9 (ref 5–15)
BUN: 12 mg/dL (ref 8–23)
CO2: 32 mmol/L (ref 22–32)
Calcium: 10 mg/dL (ref 8.9–10.3)
Chloride: 104 mmol/L (ref 98–111)
Creatinine: 0.9 mg/dL (ref 0.44–1.00)
GFR, Estimated: >60 mL/min (ref >60)
Glucose, Bld: 163 mg/dL — ABNORMAL HIGH (ref 70–99)
Potassium: 4.3 mmol/L (ref 3.5–5.1)
Sodium: 145 mmol/L (ref 135–145)
Total Bilirubin: 0.5 mg/dL (ref 0.3–1.2)
Total Protein: 7.8 g/dL (ref 6.5–8.1)

## 2022-09-22 LAB — CBC WITH DIFFERENTIAL (CANCER CENTER ONLY)
Abs Immature Granulocytes: 0.02 10*3/uL (ref 0.00–0.07)
Basophils Absolute: 0 10*3/uL (ref 0.0–0.1)
Basophils Relative: 0 %
Eosinophils Absolute: 0.1 10*3/uL (ref 0.0–0.5)
Eosinophils Relative: 1 %
HCT: 49.8 % — ABNORMAL HIGH (ref 36.0–46.0)
Hemoglobin: 14.6 g/dL (ref 12.0–15.0)
Immature Granulocytes: 0 %
Lymphocytes Relative: 23 %
Lymphs Abs: 1.5 10*3/uL (ref 0.7–4.0)
MCH: 26.7 pg (ref 26.0–34.0)
MCHC: 29.3 g/dL — ABNORMAL LOW (ref 30.0–36.0)
MCV: 91.2 fL (ref 80.0–100.0)
Monocytes Absolute: 0.6 10*3/uL (ref 0.1–1.0)
Monocytes Relative: 8 %
Neutro Abs: 4.6 10*3/uL (ref 1.7–7.7)
Neutrophils Relative %: 68 %
Platelet Count: 314 10*3/uL (ref 150–400)
RBC: 5.46 MIL/uL — ABNORMAL HIGH (ref 3.87–5.11)
RDW: 16.9 % — ABNORMAL HIGH (ref 11.5–15.5)
WBC Count: 6.8 10*3/uL (ref 4.0–10.5)
nRBC: 0 % (ref 0.0–0.2)

## 2022-09-22 LAB — FERRITIN: Ferritin: 5 ng/mL — ABNORMAL LOW (ref 11–307)

## 2022-09-22 NOTE — Progress Notes (Signed)
1/2 unit therapeutic phlebotomy performed over twenty minutes using a 20 gauge IV catheter via the left forearm. After catheter became disengaged one more attempt to obtain the remainder of the phlebotomy was unsuccessful. Patient advised to drink plenty of water and reschedule for the remainder of the needed phlebotomy next week. Patient verbalized understanding.

## 2022-09-22 NOTE — Patient Instructions (Signed)

## 2022-09-26 ENCOUNTER — Inpatient Hospital Stay: Payer: Medicare Other | Attending: Hematology & Oncology

## 2022-09-26 VITALS — BP 129/65 | HR 58 | Temp 98.6°F | Resp 17

## 2022-09-26 DIAGNOSIS — Z79899 Other long term (current) drug therapy: Secondary | ICD-10-CM | POA: Diagnosis not present

## 2022-09-26 DIAGNOSIS — D751 Secondary polycythemia: Secondary | ICD-10-CM | POA: Insufficient documentation

## 2022-09-26 NOTE — Progress Notes (Signed)
Abigail Mullins presents today for phlebotomy per MD orders. Phlebotomy procedure started at 1258 and ended at 1320. 260 grams removed via 20 gauge needle to left forearm. Patient observed for 30 minutes after procedure without any incident. Patient tolerated procedure well. IV needle removed intact.

## 2022-09-26 NOTE — Patient Instructions (Signed)

## 2022-10-02 DIAGNOSIS — F1120 Opioid dependence, uncomplicated: Secondary | ICD-10-CM | POA: Diagnosis not present

## 2022-10-02 DIAGNOSIS — G894 Chronic pain syndrome: Secondary | ICD-10-CM | POA: Diagnosis not present

## 2022-10-16 ENCOUNTER — Other Ambulatory Visit: Payer: Self-pay | Admitting: Medical Oncology

## 2022-10-16 DIAGNOSIS — D500 Iron deficiency anemia secondary to blood loss (chronic): Secondary | ICD-10-CM

## 2022-10-16 DIAGNOSIS — D751 Secondary polycythemia: Secondary | ICD-10-CM

## 2022-10-17 ENCOUNTER — Inpatient Hospital Stay: Payer: Medicare Other | Admitting: Medical Oncology

## 2022-10-17 ENCOUNTER — Other Ambulatory Visit: Payer: Self-pay | Admitting: *Deleted

## 2022-10-17 ENCOUNTER — Inpatient Hospital Stay: Payer: Medicare Other

## 2022-10-17 ENCOUNTER — Other Ambulatory Visit: Payer: Self-pay

## 2022-10-17 ENCOUNTER — Encounter: Payer: Self-pay | Admitting: Medical Oncology

## 2022-10-17 VITALS — BP 151/60 | HR 65 | Temp 98.6°F | Resp 18 | Ht 67.0 in | Wt 145.0 lb

## 2022-10-17 DIAGNOSIS — D751 Secondary polycythemia: Secondary | ICD-10-CM

## 2022-10-17 DIAGNOSIS — Z79899 Other long term (current) drug therapy: Secondary | ICD-10-CM | POA: Diagnosis not present

## 2022-10-17 DIAGNOSIS — E039 Hypothyroidism, unspecified: Secondary | ICD-10-CM

## 2022-10-17 DIAGNOSIS — D500 Iron deficiency anemia secondary to blood loss (chronic): Secondary | ICD-10-CM | POA: Diagnosis not present

## 2022-10-17 LAB — CBC WITH DIFFERENTIAL (CANCER CENTER ONLY)
Abs Immature Granulocytes: 0.02 10*3/uL (ref 0.00–0.07)
Basophils Absolute: 0.1 10*3/uL (ref 0.0–0.1)
Basophils Relative: 1 %
Eosinophils Absolute: 0.1 10*3/uL (ref 0.0–0.5)
Eosinophils Relative: 1 %
HCT: 42.9 % (ref 36.0–46.0)
Hemoglobin: 12.8 g/dL (ref 12.0–15.0)
Immature Granulocytes: 0 %
Lymphocytes Relative: 28 %
Lymphs Abs: 2.3 10*3/uL (ref 0.7–4.0)
MCH: 26.8 pg (ref 26.0–34.0)
MCHC: 29.8 g/dL — ABNORMAL LOW (ref 30.0–36.0)
MCV: 89.9 fL (ref 80.0–100.0)
Monocytes Absolute: 0.9 10*3/uL (ref 0.1–1.0)
Monocytes Relative: 11 %
Neutro Abs: 4.7 10*3/uL (ref 1.7–7.7)
Neutrophils Relative %: 59 %
Platelet Count: 311 10*3/uL (ref 150–400)
RBC: 4.77 MIL/uL (ref 3.87–5.11)
RDW: 15.1 % (ref 11.5–15.5)
WBC Count: 8 10*3/uL (ref 4.0–10.5)
nRBC: 0 % (ref 0.0–0.2)

## 2022-10-17 LAB — CMP (CANCER CENTER ONLY)
ALT: 6 U/L (ref 0–44)
AST: 17 U/L (ref 15–41)
Albumin: 4.5 g/dL (ref 3.5–5.0)
Alkaline Phosphatase: 73 U/L (ref 38–126)
Anion gap: 6 (ref 5–15)
BUN: 13 mg/dL (ref 8–23)
CO2: 30 mmol/L (ref 22–32)
Calcium: 9.5 mg/dL (ref 8.9–10.3)
Chloride: 102 mmol/L (ref 98–111)
Creatinine: 0.56 mg/dL (ref 0.44–1.00)
GFR, Estimated: >60 mL/min (ref >60)
Glucose, Bld: 113 mg/dL — ABNORMAL HIGH (ref 70–99)
Potassium: 5.1 mmol/L (ref 3.5–5.1)
Sodium: 138 mmol/L (ref 135–145)
Total Bilirubin: 0.4 mg/dL (ref 0.3–1.2)
Total Protein: 7.3 g/dL (ref 6.5–8.1)

## 2022-10-17 LAB — TSH: TSH: 0.97 u[IU]/mL (ref 0.350–4.500)

## 2022-10-17 LAB — FERRITIN: Ferritin: 4 ng/mL — ABNORMAL LOW (ref 11–307)

## 2022-10-17 NOTE — Progress Notes (Signed)
Hematology and Oncology Follow Up Visit  Abigail Mullins [id number] 01/09/1951 72 y.o. 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 is here today for follow-up.   Overall she reports that she is doing well. Has had some fatigue and continues to have her chronic back pain. She is working with her PCP Dr. Danielle Dess regarding this and recently had a medication adjustment from one type of long acting pain medication to another given costs.   No bruising or petechiae.  No fever, chills, n/v, cough, rash, dizziness, SOB, chest pain, palpitations, abdominal pain or changes in bowel or bladder habits.   No swelling in her extremities.  Numbness and tingling in her hands and feet waxes and wanes.  No falls or syncope.  Appetite and hydration are good.  Wt Readings from Last 3 Encounters:  10/17/22 145 lb (65.8 kg)  05/17/22 150 lb 6.4 oz (68.2 kg)  02/13/22 148 lb (67.1 kg)     ECOG Performance Status: 1 - Symptomatic but completely ambulatory  Medications:  Allergies as of 10/17/2022   No Known Allergies      Medication List        Accurate as of October 17, 2022  1:37 PM. If you have any questions, ask your nurse or doctor.          STOP taking these medications    oxyCODONE-acetaminophen 10-325 MG tablet Commonly known as: PERCOCET Stopped by: [facility] PO Stopped by: Rushie Chestnut       TAKE these medications    amLODipine 5 MG tablet Commonly known as: NORVASC Take 5 mg by mouth daily.   fexofenadine 60 MG tablet Commonly known as: ALLEGRA Take 60 mg by mouth as needed.   ibuprofen 800 MG tablet Commonly known as: ADVIL Take 800 mg by mouth as needed.   metoprolol succinate 25 MG 24 hr tablet Commonly known as: TOPROL-XL Take 25 mg by mouth daily.    morphine 30 MG 12 hr tablet Commonly known as: MS CONTIN Take 30 mg by mouth every 12 (twelve) hours.   polyethylene glycol 17 g packet Commonly known as: MIRALAX / GLYCOLAX Mix 1 capful in 8 ounces of fluid   SYSTANE ULTRA PF OP Apply to eye as needed.        Allergies: No Known Allergies  Past Medical History, Surgical history, Social history, and Family History were reviewed and updated.  Review of Systems: All other 10 point review of systems is negative.   Physical Exam:  height is 5\' 7"  (1.702 m) and weight is 145 lb (65.8 kg). Her oral temperature is 98.6 F (37 C). Her blood pressure is 151/60 (abnormal) and her pulse is 65. Her respiration is 18 and oxygen saturation is 100%.   Wt Readings from Last 3 Encounters:  10/17/22 145 lb (65.8 kg)  05/17/22 150 lb 6.4 oz (68.2 kg)  02/13/22 148 lb (67.1 kg)    Ocular: Sclerae unicteric, pupils equal, round and reactive to light Ear-nose-throat: Oropharynx clear, dentition fair Lymphatic: No cervical or supraclavicular adenopathy Lungs no rales or rhonchi, good excursion bilaterally Heart regular rate and rhythm, no murmur appreciated Abd soft, nontender, positive bowel sounds MSK no focal spinal tenderness, no joint edema Neuro: non-focal, well-oriented, appropriate affect  Lab  Results  Component Value Date   WBC 8.0 10/17/2022   HGB 12.8 10/17/2022   HCT 42.9 10/17/2022   MCV 89.9 10/17/2022   PLT 311 10/17/2022   Lab Results  Component Value Date   FERRITIN 5 (L) 09/22/2022   IRON 41 09/22/2022   TIBC 535 (H) 09/22/2022   UIBC 494 (H) 09/22/2022   IRONPCTSAT 8 (L) 09/22/2022   Lab Results  Component Value Date   RETICCTPCT 4.4 (H) 08/24/2020   RBC 4.77 10/17/2022   No results found for: "KPAFRELGTCHN", "LAMBDASER", "KAPLAMBRATIO" No results found for: "IGGSERUM", "IGA", "IGMSERUM" No results found for: "TOTALPROTELP", "ALBUMINELP", "A1GS", "A2GS", "BETS", "BETA2SER", "GAMS", "MSPIKE", "SPEI"    Chemistry      Component Value Date/Time   NA 145 09/22/2022 1238   K 4.3 09/22/2022 1238   CL 104 09/22/2022 1238   CO2 32 09/22/2022 1238   BUN 12 09/22/2022 1238   CREATININE 0.90 09/22/2022 1238      Component Value Date/Time   CALCIUM 10.0 09/22/2022 1238   ALKPHOS 81 09/22/2022 1238   AST 17 09/22/2022 1238   ALT 10 09/22/2022 1238   BILITOT 0.5 09/22/2022 1238       Impression and Plan: [name] is a very pleasant 72 yo caucasian female with polycythemia, secondary. JAK 2 and hemochromatosis DNA were both negative.  Given fatigue and hemochromatosis history will obtain a TSH.   No Phlebotomy today- Hct 42.9% with 12.8 Hgb  RTC 3 months APP, labs (CBC w/, CMP, iron, ferritin, retic, TSH)  Rushie Chestnut, PA-C 7/23/20241:37 PM

## 2022-10-18 LAB — IRON AND IRON BINDING CAPACITY (CC-WL,HP ONLY)
Iron: 40 ug/dL (ref 28–170)
Saturation Ratios: 8 % — ABNORMAL LOW (ref 10.4–31.8)
TIBC: 529 ug/dL — ABNORMAL HIGH (ref 250–450)
UIBC: 489 ug/dL — ABNORMAL HIGH (ref 148–442)

## 2022-10-27 ENCOUNTER — Inpatient Hospital Stay: Payer: Medicare Other

## 2022-10-27 ENCOUNTER — Ambulatory Visit: Payer: Medicare Other | Admitting: Family

## 2022-10-31 DIAGNOSIS — R399 Unspecified symptoms and signs involving the genitourinary system: Secondary | ICD-10-CM | POA: Diagnosis not present

## 2022-10-31 DIAGNOSIS — F17200 Nicotine dependence, unspecified, uncomplicated: Secondary | ICD-10-CM | POA: Diagnosis not present

## 2022-11-15 ENCOUNTER — Other Ambulatory Visit: Payer: Medicare Other

## 2022-11-15 ENCOUNTER — Ambulatory Visit: Payer: Medicare Other | Admitting: Family

## 2022-12-27 DIAGNOSIS — F1120 Opioid dependence, uncomplicated: Secondary | ICD-10-CM | POA: Diagnosis not present

## 2022-12-27 DIAGNOSIS — G894 Chronic pain syndrome: Secondary | ICD-10-CM | POA: Diagnosis not present

## 2023-01-11 DIAGNOSIS — R3989 Other symptoms and signs involving the genitourinary system: Secondary | ICD-10-CM | POA: Diagnosis not present

## 2023-01-11 DIAGNOSIS — R350 Frequency of micturition: Secondary | ICD-10-CM | POA: Diagnosis not present

## 2023-01-11 DIAGNOSIS — Z124 Encounter for screening for malignant neoplasm of cervix: Secondary | ICD-10-CM | POA: Diagnosis not present

## 2023-01-11 DIAGNOSIS — N952 Postmenopausal atrophic vaginitis: Secondary | ICD-10-CM | POA: Diagnosis not present

## 2023-01-17 ENCOUNTER — Other Ambulatory Visit: Payer: Self-pay

## 2023-01-17 DIAGNOSIS — D751 Secondary polycythemia: Secondary | ICD-10-CM

## 2023-01-18 ENCOUNTER — Inpatient Hospital Stay: Payer: Medicare Other

## 2023-01-18 ENCOUNTER — Inpatient Hospital Stay: Payer: Medicare Other | Admitting: Medical Oncology

## 2023-01-18 ENCOUNTER — Inpatient Hospital Stay: Payer: Medicare Other | Attending: Hematology & Oncology

## 2023-01-18 ENCOUNTER — Encounter: Payer: Self-pay | Admitting: Medical Oncology

## 2023-01-18 VITALS — BP 125/78 | HR 66

## 2023-01-18 VITALS — BP 137/66 | HR 61 | Temp 98.1°F | Resp 17 | Wt 147.0 lb

## 2023-01-18 DIAGNOSIS — Z79899 Other long term (current) drug therapy: Secondary | ICD-10-CM | POA: Insufficient documentation

## 2023-01-18 DIAGNOSIS — D751 Secondary polycythemia: Secondary | ICD-10-CM

## 2023-01-18 LAB — CMP (CANCER CENTER ONLY)
ALT: 8 U/L (ref 0–44)
AST: 17 U/L (ref 15–41)
Albumin: 4.6 g/dL (ref 3.5–5.0)
Alkaline Phosphatase: 64 U/L (ref 38–126)
Anion gap: 9 (ref 5–15)
BUN: 11 mg/dL (ref 8–23)
CO2: 31 mmol/L (ref 22–32)
Calcium: 9.5 mg/dL (ref 8.9–10.3)
Chloride: 103 mmol/L (ref 98–111)
Creatinine: 0.65 mg/dL (ref 0.44–1.00)
GFR, Estimated: >60 mL/min (ref >60)
Glucose, Bld: 132 mg/dL — ABNORMAL HIGH (ref 70–99)
Potassium: 5.7 mmol/L — ABNORMAL HIGH (ref 3.5–5.1)
Sodium: 143 mmol/L (ref 135–145)
Total Bilirubin: 0.4 mg/dL (ref 0.3–1.2)
Total Protein: 7.1 g/dL (ref 6.5–8.1)

## 2023-01-18 LAB — CBC WITH DIFFERENTIAL (CANCER CENTER ONLY)
Abs Immature Granulocytes: 0.09 10*3/uL — ABNORMAL HIGH (ref 0.00–0.07)
Basophils Absolute: 0.1 10*3/uL (ref 0.0–0.1)
Basophils Relative: 1 %
Eosinophils Absolute: 0.1 10*3/uL (ref 0.0–0.5)
Eosinophils Relative: 2 %
HCT: 49.9 % — ABNORMAL HIGH (ref 36.0–46.0)
Hemoglobin: 14.9 g/dL (ref 12.0–15.0)
Immature Granulocytes: 1 %
Lymphocytes Relative: 36 %
Lymphs Abs: 2.7 10*3/uL (ref 0.7–4.0)
MCH: 27.5 pg (ref 26.0–34.0)
MCHC: 29.9 g/dL — ABNORMAL LOW (ref 30.0–36.0)
MCV: 92.1 fL (ref 80.0–100.0)
Monocytes Absolute: 0.7 10*3/uL (ref 0.1–1.0)
Monocytes Relative: 10 %
Neutro Abs: 3.8 10*3/uL (ref 1.7–7.7)
Neutrophils Relative %: 50 %
Platelet Count: 310 10*3/uL (ref 150–400)
RBC: 5.42 MIL/uL — ABNORMAL HIGH (ref 3.87–5.11)
RDW: 15.9 % — ABNORMAL HIGH (ref 11.5–15.5)
WBC Count: 7.5 10*3/uL (ref 4.0–10.5)
nRBC: 0 % (ref 0.0–0.2)

## 2023-01-18 LAB — FERRITIN: Ferritin: 6 ng/mL — ABNORMAL LOW (ref 11–307)

## 2023-01-18 LAB — RETICULOCYTES
Immature Retic Fract: 10.7 % (ref 2.3–15.9)
RBC.: 5.37 MIL/uL — ABNORMAL HIGH (ref 3.87–5.11)
Retic Count, Absolute: 73.6 10*3/uL (ref 19.0–186.0)
Retic Ct Pct: 1.4 % (ref 0.4–3.1)

## 2023-01-18 LAB — TSH: TSH: 2.062 u[IU]/mL (ref 0.350–4.500)

## 2023-01-18 LAB — IRON AND IRON BINDING CAPACITY (CC-WL,HP ONLY)
Iron: 42 ug/dL (ref 28–170)
Saturation Ratios: 8 % — ABNORMAL LOW (ref 10.4–31.8)
TIBC: 518 ug/dL — ABNORMAL HIGH (ref 250–450)
UIBC: 476 ug/dL — ABNORMAL HIGH (ref 148–442)

## 2023-01-18 NOTE — Patient Instructions (Signed)
Avoid potassium rich foods

## 2023-01-18 NOTE — Progress Notes (Signed)
1 unit therapeutic phlebotomy performed over minutes 23 using a 20 g IV catheter via the right AC. Patient tolerated well. Nourishment provided.  Patient does not want to stay for the recommended 30 minute post phlebotomy observation. VSS. Patient discharged ambulatory without complaints or concerns.

## 2023-01-18 NOTE — Progress Notes (Signed)
Hematology and Oncology Follow Up Visit  Myra Prunty [id number] 31-Jul-1950 72 y.o. 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 Mayella Cicchino is here today for follow-up.   Overall she is doing well aside from her chronic back pain which is stable.   No bruising or petechiae.  No fever, chills, n/v, cough, rash, dizziness, SOB, chest pain, palpitations, abdominal pain or changes in bowel or bladder habits.   No swelling in her extremities.  Numbness and tingling in her hands and feet waxes and wanes.  No falls or syncope.  Appetite and hydration are good.  Wt Readings from Last 3 Encounters:  01/18/23 147 lb (66.7 kg)  10/17/22 145 lb (65.8 kg)  05/17/22 150 lb 6.4 oz (68.2 kg)     ECOG Performance Status: 1 - Symptomatic but completely ambulatory  Medications:  Allergies as of 01/18/2023   No Known Allergies      Medication List        Accurate as of January 18, 2023 12:11 PM. If you have any questions, ask your nurse or doctor.          amLODipine 5 MG tablet Commonly known as: NORVASC Take 5 mg by mouth daily.   estradiol 0.1 MG/GM vaginal cream Commonly known as: ESTRACE Place 1 Applicatorful vaginally as directed. 1 gram of cream in vagina on Monday and Thursday nights   fexofenadine 60 MG tablet Commonly known as: ALLEGRA Take 60 mg by mouth as needed.   ibuprofen 800 MG tablet Commonly known as: ADVIL Take 800 mg by mouth as needed.   metoprolol succinate 25 MG 24 hr tablet Commonly known as: TOPROL-XL Take 25 mg by mouth daily.   morphine 30 MG 12 hr tablet Commonly known as: MS CONTIN Take 30 mg by mouth every 12 (twelve) hours.   oxyCODONE-acetaminophen 10-325 MG tablet Commonly known as: PERCOCET Take 1 tablet by mouth every 4 (four) hours as needed for pain.    polyethylene glycol 17 g packet Commonly known as: MIRALAX / GLYCOLAX Mix 1 capful in 8 ounces of fluid   SYSTANE ULTRA PF OP Apply to eye as needed.        Allergies: No Known Allergies  Past Medical History, Surgical history, Social history, and Family History were reviewed and updated.  Review of Systems: All other 10 point review of systems is negative.   Physical Exam:  weight is 147 lb (66.7 kg). Her oral temperature is 98.1 F (36.7 C). Her blood pressure is 137/66 and her pulse is 61. Her respiration is 17 and oxygen saturation is 94%.   Wt Readings from Last 3 Encounters:  01/18/23 147 lb (66.7 kg)  10/17/22 145 lb (65.8 kg)  05/17/22 150 lb 6.4 oz (68.2 kg)    Ocular: Sclerae unicteric, pupils equal, round and reactive to light Ear-nose-throat: Oropharynx clear, dentition fair Lymphatic: No cervical or supraclavicular adenopathy Lungs no rales or rhonchi, good excursion bilaterally Heart regular rate and rhythm, no murmur appreciated Abd soft, nontender, positive bowel sounds MSK no focal spinal tenderness, no joint edema Neuro: non-focal, well-oriented, appropriate affect  Lab Results  Component Value Date   WBC 7.5 01/18/2023   HGB 14.9 01/18/2023   HCT 49.9 (H) 01/18/2023   MCV 92.1 01/18/2023   PLT 310 01/18/2023   Lab  Results  Component Value Date   FERRITIN 4 (L) 10/17/2022   IRON 40 10/17/2022   TIBC 529 (H) 10/17/2022   UIBC 489 (H) 10/17/2022   IRONPCTSAT 8 (L) 10/17/2022   Lab Results  Component Value Date   RETICCTPCT 1.4 01/18/2023   RBC 5.37 (H) 01/18/2023   No results found for: "KPAFRELGTCHN", "LAMBDASER", "KAPLAMBRATIO" No results found for: "IGGSERUM", "IGA", "IGMSERUM" No results found for: "TOTALPROTELP", "ALBUMINELP", "A1GS", "A2GS", "BETS", "BETA2SER", "GAMS", "MSPIKE", "SPEI"   Chemistry      Component Value Date/Time   NA 143 01/18/2023 1118   K 5.7 (H) 01/18/2023 1118   CL 103 01/18/2023 1118   CO2 31 01/18/2023  1118   BUN 11 01/18/2023 1118   CREATININE 0.65 01/18/2023 1118      Component Value Date/Time   CALCIUM 9.5 01/18/2023 1118   ALKPHOS 64 01/18/2023 1118   AST 17 01/18/2023 1118   ALT 8 01/18/2023 1118   BILITOT 0.4 01/18/2023 1118      Encounter Diagnosis  Name Primary?   Polycythemia, secondary Yes    Impression and Plan: [name] is a very pleasant 72 yo caucasian female with polycythemia, secondary. JAK 2 and hemochromatosis DNA were both negative.  Also discussed her elevated potassium level. She will make some dietary changes.   Phlebotomy today- Hct 49.9% with 14.9 Hgb  RTC 3 months APP, labs (CBC w/, CMP, iron, ferritin, retic) +_ phlebotomy   Rushie Chestnut, PA-C 10/24/202412:11 PM

## 2023-01-18 NOTE — Patient Instructions (Signed)

## 2023-01-19 ENCOUNTER — Other Ambulatory Visit: Payer: Self-pay | Admitting: Medical Oncology

## 2023-01-24 DIAGNOSIS — H26492 Other secondary cataract, left eye: Secondary | ICD-10-CM | POA: Diagnosis not present

## 2023-02-05 DIAGNOSIS — I10 Essential (primary) hypertension: Secondary | ICD-10-CM | POA: Diagnosis not present

## 2023-02-05 DIAGNOSIS — I700 Atherosclerosis of aorta: Secondary | ICD-10-CM | POA: Diagnosis not present

## 2023-02-05 DIAGNOSIS — D582 Other hemoglobinopathies: Secondary | ICD-10-CM | POA: Diagnosis not present

## 2023-02-05 DIAGNOSIS — Z23 Encounter for immunization: Secondary | ICD-10-CM | POA: Diagnosis not present

## 2023-02-05 DIAGNOSIS — Z1159 Encounter for screening for other viral diseases: Secondary | ICD-10-CM | POA: Diagnosis not present

## 2023-02-05 DIAGNOSIS — M549 Dorsalgia, unspecified: Secondary | ICD-10-CM | POA: Diagnosis not present

## 2023-02-05 DIAGNOSIS — Z0000 Encounter for general adult medical examination without abnormal findings: Secondary | ICD-10-CM | POA: Diagnosis not present

## 2023-03-27 DIAGNOSIS — L570 Actinic keratosis: Secondary | ICD-10-CM | POA: Diagnosis not present

## 2023-03-27 DIAGNOSIS — L821 Other seborrheic keratosis: Secondary | ICD-10-CM | POA: Diagnosis not present

## 2023-03-29 DIAGNOSIS — F1120 Opioid dependence, uncomplicated: Secondary | ICD-10-CM | POA: Diagnosis not present

## 2023-03-29 DIAGNOSIS — G894 Chronic pain syndrome: Secondary | ICD-10-CM | POA: Diagnosis not present

## 2023-04-04 DIAGNOSIS — H0288A Meibomian gland dysfunction right eye, upper and lower eyelids: Secondary | ICD-10-CM | POA: Diagnosis not present

## 2023-04-04 DIAGNOSIS — Z9889 Other specified postprocedural states: Secondary | ICD-10-CM | POA: Diagnosis not present

## 2023-04-04 DIAGNOSIS — H15001 Unspecified scleritis, right eye: Secondary | ICD-10-CM | POA: Diagnosis not present

## 2023-04-04 DIAGNOSIS — Z961 Presence of intraocular lens: Secondary | ICD-10-CM | POA: Diagnosis not present

## 2023-04-13 ENCOUNTER — Inpatient Hospital Stay: Payer: Medicare Other | Attending: Hematology & Oncology

## 2023-04-13 ENCOUNTER — Other Ambulatory Visit: Payer: Self-pay | Admitting: Family

## 2023-04-13 ENCOUNTER — Inpatient Hospital Stay: Payer: Medicare Other | Admitting: Family

## 2023-04-13 ENCOUNTER — Inpatient Hospital Stay: Payer: Medicare Other

## 2023-04-13 VITALS — BP 136/73 | HR 65 | Temp 98.3°F | Resp 16 | Ht 67.0 in | Wt 149.0 lb

## 2023-04-13 VITALS — BP 131/80 | HR 68

## 2023-04-13 DIAGNOSIS — D751 Secondary polycythemia: Secondary | ICD-10-CM

## 2023-04-13 DIAGNOSIS — G8929 Other chronic pain: Secondary | ICD-10-CM | POA: Insufficient documentation

## 2023-04-13 DIAGNOSIS — D500 Iron deficiency anemia secondary to blood loss (chronic): Secondary | ICD-10-CM | POA: Diagnosis not present

## 2023-04-13 DIAGNOSIS — G629 Polyneuropathy, unspecified: Secondary | ICD-10-CM | POA: Diagnosis not present

## 2023-04-13 LAB — CMP (CANCER CENTER ONLY)
ALT: 37 U/L (ref 0–44)
AST: 42 U/L — ABNORMAL HIGH (ref 15–41)
Albumin: 4.3 g/dL (ref 3.5–5.0)
Alkaline Phosphatase: 80 U/L (ref 38–126)
Anion gap: 9 (ref 5–15)
BUN: 11 mg/dL (ref 8–23)
CO2: 30 mmol/L (ref 22–32)
Calcium: 9.3 mg/dL (ref 8.9–10.3)
Chloride: 102 mmol/L (ref 98–111)
Creatinine: 0.6 mg/dL (ref 0.44–1.00)
GFR, Estimated: >60 mL/min (ref >60)
Glucose, Bld: 126 mg/dL — ABNORMAL HIGH (ref 70–99)
Potassium: 5.1 mmol/L (ref 3.5–5.1)
Sodium: 141 mmol/L (ref 135–145)
Total Bilirubin: 0.4 mg/dL (ref 0.0–1.2)
Total Protein: 7.1 g/dL (ref 6.5–8.1)

## 2023-04-13 LAB — CBC WITH DIFFERENTIAL (CANCER CENTER ONLY)
Abs Immature Granulocytes: 0.02 10*3/uL (ref 0.00–0.07)
Basophils Absolute: 0 10*3/uL (ref 0.0–0.1)
Basophils Relative: 1 %
Eosinophils Absolute: 0.1 10*3/uL (ref 0.0–0.5)
Eosinophils Relative: 1 %
HCT: 47.3 % — ABNORMAL HIGH (ref 36.0–46.0)
Hemoglobin: 14.7 g/dL (ref 12.0–15.0)
Immature Granulocytes: 0 %
Lymphocytes Relative: 18 %
Lymphs Abs: 1.3 10*3/uL (ref 0.7–4.0)
MCH: 28.3 pg (ref 26.0–34.0)
MCHC: 31.1 g/dL (ref 30.0–36.0)
MCV: 91 fL (ref 80.0–100.0)
Monocytes Absolute: 0.7 10*3/uL (ref 0.1–1.0)
Monocytes Relative: 9 %
Neutro Abs: 5.3 10*3/uL (ref 1.7–7.7)
Neutrophils Relative %: 71 %
Platelet Count: 318 10*3/uL (ref 150–400)
RBC: 5.2 MIL/uL — ABNORMAL HIGH (ref 3.87–5.11)
RDW: 13.9 % (ref 11.5–15.5)
WBC Count: 7.4 10*3/uL (ref 4.0–10.5)
nRBC: 0 % (ref 0.0–0.2)

## 2023-04-13 LAB — RETICULOCYTES
Immature Retic Fract: 12.8 % (ref 2.3–15.9)
RBC.: 5.14 MIL/uL — ABNORMAL HIGH (ref 3.87–5.11)
Retic Count, Absolute: 76.1 10*3/uL (ref 19.0–186.0)
Retic Ct Pct: 1.5 % (ref 0.4–3.1)

## 2023-04-13 LAB — IRON AND IRON BINDING CAPACITY (CC-WL,HP ONLY)
Iron: 39 ug/dL (ref 28–170)
Saturation Ratios: 8 % — ABNORMAL LOW (ref 10.4–31.8)
TIBC: 501 ug/dL — ABNORMAL HIGH (ref 250–450)
UIBC: 462 ug/dL — ABNORMAL HIGH (ref 148–442)

## 2023-04-13 LAB — FERRITIN: Ferritin: 6 ng/mL — ABNORMAL LOW (ref 11–307)

## 2023-04-13 NOTE — Progress Notes (Addendum)
Pt. arrived for therapeutic phlebotomy via peripheral IV.  Tolerated procedure well, appr. 505g phlebotomized over 20 minutes. Stable throughout and post. V/s stable post.

## 2023-04-13 NOTE — Progress Notes (Signed)
Hematology and Oncology Follow Up Visit  Yesli Salvato [id number] 12/20/1950 73 y.o. 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 Crislyn Froehle is here today for follow-up and phlebotomy. Hct is 47%.  She is doing fairly well. He toughest issue at this time is chronic back pain. However, she pushes through and is still getting out doing the things she enjoys.  No fever, chills, n/v, cough, rash, dizziness, SOB, chest pain, palpitations, abdominal pain or changes in bowel or bladder habits.  No swelling in her extremities.  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  No falls or syncope reported.  Appetite and hydration are good. Weight is stable at 149 lbs.   ECOG Performance Status: 1 - Symptomatic but completely ambulatory  Medications:  Allergies as of 04/13/2023   No Known Allergies      Medication List        Accurate as of April 13, 2023  2:05 PM. If you have any questions, ask your nurse or doctor.          amLODipine 5 MG tablet Commonly known as: NORVASC Take 5 mg by mouth daily.   estradiol 0.1 MG/GM vaginal cream Commonly known as: ESTRACE Place 1 Applicatorful vaginally as directed. 1 gram of cream in vagina on Monday and Thursday nights   fexofenadine 60 MG tablet Commonly known as: ALLEGRA Take 60 mg by mouth as needed.   ibuprofen 800 MG tablet Commonly known as: ADVIL Take 800 mg by mouth as needed.   metoprolol succinate 25 MG 24 hr tablet Commonly known as: TOPROL-XL Take 25 mg by mouth daily.   morphine 30 MG 12 hr tablet Commonly known as: MS CONTIN Take 30 mg by mouth every 12 (twelve) hours.   oxyCODONE-acetaminophen 10-325 MG tablet Commonly known as: PERCOCET Take 1 tablet by mouth every 4 (four) hours as needed for pain.    polyethylene glycol 17 g packet Commonly known as: MIRALAX / GLYCOLAX Mix 1 capful in 8 ounces of fluid   SYSTANE ULTRA PF OP Apply to eye as needed.        Allergies: No Known Allergies  Past Medical History, Surgical history, Social history, and Family History were reviewed and updated.  Review of Systems: All other 10 point review of systems is negative.   Physical Exam:  height is 5\' 7"  (1.702 m) and weight is 149 lb (67.6 kg). Her oral temperature is 98.3 F (36.8 C). Her blood pressure is 136/73 and her pulse is 65. Her respiration is 16 and oxygen saturation is 98%.   Wt Readings from Last 3 Encounters:  04/13/23 149 lb (67.6 kg)  01/18/23 147 lb (66.7 kg)  10/17/22 145 lb (65.8 kg)    Ocular: Sclerae unicteric, pupils equal, round and reactive to light Ear-nose-throat: Oropharynx clear, dentition fair Lymphatic: No cervical or supraclavicular adenopathy Lungs no rales or rhonchi, good excursion bilaterally Heart regular rate and rhythm, no murmur appreciated Abd soft, nontender, positive bowel sounds MSK no focal spinal tenderness, no joint edema Neuro: non-focal, well-oriented, appropriate affect Breasts: Deferred  Lab Results  Component Value Date   WBC 7.4 04/13/2023   HGB 14.7 04/13/2023   HCT 47.3 (H) 04/13/2023   MCV 91.0 04/13/2023  PLT 318 04/13/2023   Lab Results  Component Value Date   FERRITIN 6 (L) 01/18/2023   IRON 42 01/18/2023   TIBC 518 (H) 01/18/2023   UIBC 476 (H) 01/18/2023   IRONPCTSAT 8 (L) 01/18/2023   Lab Results  Component Value Date   RETICCTPCT 1.5 04/13/2023   RBC 5.20 (H) 04/13/2023   RBC 5.14 (H) 04/13/2023   No results found for: "KPAFRELGTCHN", "LAMBDASER", "KAPLAMBRATIO" No results found for: "IGGSERUM", "IGA", "IGMSERUM" No results found for: "TOTALPROTELP", "ALBUMINELP", "A1GS", "A2GS", "BETS", "BETA2SER", "GAMS", "MSPIKE", "SPEI"   Chemistry      Component Value Date/Time   NA 141 04/13/2023 1312   K 5.1  04/13/2023 1312   CL 102 04/13/2023 1312   CO2 30 04/13/2023 1312   BUN 11 04/13/2023 1312   CREATININE 0.60 04/13/2023 1312      Component Value Date/Time   CALCIUM 9.3 04/13/2023 1312   ALKPHOS 80 04/13/2023 1312   AST 42 (H) 04/13/2023 1312   ALT 37 04/13/2023 1312   BILITOT 0.4 04/13/2023 1312       Impression and Plan: [name] is a very pleasant 72 yo caucasian female with polycythemia, secondary. JAK 2 and hemochromatosis DNA were both negative.  Phlebotomy today for Hct 47%.  Follow-up in another 3 months.   Eileen Stanford, NP 1/17/20252:05 PM

## 2023-04-13 NOTE — Patient Instructions (Signed)

## 2023-06-19 DIAGNOSIS — G894 Chronic pain syndrome: Secondary | ICD-10-CM | POA: Diagnosis not present

## 2023-06-19 DIAGNOSIS — F1120 Opioid dependence, uncomplicated: Secondary | ICD-10-CM | POA: Diagnosis not present

## 2023-07-13 ENCOUNTER — Encounter: Payer: Self-pay | Admitting: Family

## 2023-07-13 ENCOUNTER — Inpatient Hospital Stay: Payer: Medicare Other | Admitting: Family

## 2023-07-13 ENCOUNTER — Inpatient Hospital Stay: Payer: Medicare Other | Attending: Hematology & Oncology

## 2023-07-13 ENCOUNTER — Inpatient Hospital Stay: Payer: Medicare Other

## 2023-07-13 VITALS — BP 140/62 | HR 65 | Temp 98.1°F | Resp 18 | Ht 67.0 in | Wt 148.1 lb

## 2023-07-13 VITALS — BP 138/76 | HR 62

## 2023-07-13 DIAGNOSIS — D751 Secondary polycythemia: Secondary | ICD-10-CM | POA: Diagnosis not present

## 2023-07-13 DIAGNOSIS — D500 Iron deficiency anemia secondary to blood loss (chronic): Secondary | ICD-10-CM

## 2023-07-13 DIAGNOSIS — Z862 Personal history of diseases of the blood and blood-forming organs and certain disorders involving the immune mechanism: Secondary | ICD-10-CM | POA: Diagnosis not present

## 2023-07-13 LAB — CBC WITH DIFFERENTIAL (CANCER CENTER ONLY)
Abs Immature Granulocytes: 0.04 10*3/uL (ref 0.00–0.07)
Basophils Absolute: 0 10*3/uL (ref 0.0–0.1)
Basophils Relative: 0 %
Eosinophils Absolute: 0.1 10*3/uL (ref 0.0–0.5)
Eosinophils Relative: 1 %
HCT: 47.6 % — ABNORMAL HIGH (ref 36.0–46.0)
Hemoglobin: 14.6 g/dL (ref 12.0–15.0)
Immature Granulocytes: 0 %
Lymphocytes Relative: 21 %
Lymphs Abs: 2.2 10*3/uL (ref 0.7–4.0)
MCH: 27.8 pg (ref 26.0–34.0)
MCHC: 30.7 g/dL (ref 30.0–36.0)
MCV: 90.5 fL (ref 80.0–100.0)
Monocytes Absolute: 0.7 10*3/uL (ref 0.1–1.0)
Monocytes Relative: 7 %
Neutro Abs: 7.1 10*3/uL (ref 1.7–7.7)
Neutrophils Relative %: 71 %
Platelet Count: 307 10*3/uL (ref 150–400)
RBC: 5.26 MIL/uL — ABNORMAL HIGH (ref 3.87–5.11)
RDW: 14.5 % (ref 11.5–15.5)
WBC Count: 10.1 10*3/uL (ref 4.0–10.5)
nRBC: 0 % (ref 0.0–0.2)

## 2023-07-13 LAB — CMP (CANCER CENTER ONLY)
ALT: 7 U/L (ref 0–44)
AST: 16 U/L (ref 15–41)
Albumin: 4.4 g/dL (ref 3.5–5.0)
Alkaline Phosphatase: 67 U/L (ref 38–126)
Anion gap: 9 (ref 5–15)
BUN: 11 mg/dL (ref 8–23)
CO2: 28 mmol/L (ref 22–32)
Calcium: 9.8 mg/dL (ref 8.9–10.3)
Chloride: 103 mmol/L (ref 98–111)
Creatinine: 0.57 mg/dL (ref 0.44–1.00)
GFR, Estimated: >60 mL/min (ref >60)
Glucose, Bld: 120 mg/dL — ABNORMAL HIGH (ref 70–99)
Potassium: 5.3 mmol/L — ABNORMAL HIGH (ref 3.5–5.1)
Sodium: 140 mmol/L (ref 135–145)
Total Bilirubin: 0.3 mg/dL (ref 0.0–1.2)
Total Protein: 7.1 g/dL (ref 6.5–8.1)

## 2023-07-13 LAB — RETICULOCYTES
Immature Retic Fract: 13.2 % (ref 2.3–15.9)
RBC.: 5.21 MIL/uL — ABNORMAL HIGH (ref 3.87–5.11)
Retic Count, Absolute: 79.2 10*3/uL (ref 19.0–186.0)
Retic Ct Pct: 1.5 % (ref 0.4–3.1)

## 2023-07-13 LAB — IRON AND IRON BINDING CAPACITY (CC-WL,HP ONLY)
Iron: 37 ug/dL (ref 28–170)
Saturation Ratios: 8 % — ABNORMAL LOW (ref 10.4–31.8)
TIBC: 491 ug/dL — ABNORMAL HIGH (ref 250–450)
UIBC: 454 ug/dL — ABNORMAL HIGH (ref 148–442)

## 2023-07-13 LAB — FERRITIN: Ferritin: 5 ng/mL — ABNORMAL LOW (ref 11–307)

## 2023-07-13 NOTE — Progress Notes (Signed)
 Abigail Mullins presents today for phlebotomy per MD orders. Phlebotomy procedure started at 1410 and ended at 1430. 550 grams removed. Patient observed for 30 minutes after procedure without any incident. Patient tolerated procedure well. IV needle removed intact.

## 2023-07-13 NOTE — Progress Notes (Signed)
 Hematology and Oncology Follow Up Visit  Abigail Mullins [id number] 02-19-1951 73 y.o. 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 She is doing well aside from the chronic back pain.  Hct is 47.6%. No bruising or petechiae.  No swelling in her extremities.  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  No falls or syncope reported.  No fever, chills, n/v, cough, rash, dizziness, SOB, chest pain, palpitations, abdominal pain or changes in bowel or bladder habits.  Appetite and hydration are good. Weight is stable at 148 lbs.   ECOG Performance Status: 1 - Symptomatic but completely ambulatory  Medications:  Allergies as of 07/13/2023   No Known Allergies      Medication List        Accurate as of July 13, 2023  1:53 PM. If you have any questions, ask your nurse or doctor.          amLODipine 5 MG tablet Commonly known as: NORVASC Take 5 mg by mouth daily.   celecoxib 200 MG capsule Commonly known as: CELEBREX Take 200 mg by mouth daily.   estradiol 0.1 MG/GM vaginal cream Commonly known as: ESTRACE Place 1 Applicatorful vaginally as directed. 1 gram of cream in vagina on Monday and Thursday nights   fexofenadine 60 MG tablet Commonly known as: ALLEGRA Take 60 mg by mouth as needed.   ibuprofen 800 MG tablet Commonly known as: ADVIL Take 800 mg by mouth as needed.   metoprolol succinate 25 MG 24 hr tablet Commonly known as: TOPROL-XL Take 25 mg by mouth daily.   morphine 30 MG 12 hr tablet Commonly known as: MS CONTIN Take 30 mg by mouth every 12 (twelve) hours.   oxyCODONE-acetaminophen 10-325 MG tablet Commonly known as: PERCOCET Take 1 tablet by mouth every 4 (four) hours as needed for pain.   polyethylene glycol 17 g  packet Commonly known as: MIRALAX / GLYCOLAX Mix 1 capful in 8 ounces of fluid   SYSTANE ULTRA PF OP Apply to eye as needed.        Allergies: No Known Allergies  Past Medical History, Surgical history, Social history, and Family History were reviewed and updated.  Review of Systems: All other 10 point review of systems is negative.   Physical Exam:  height is 5' 7 (1.702 m) and weight is 148 lb 1.9 oz (67.2 kg). Her oral temperature is 98.1 F (36.7 C). Her blood pressure is 140/62 (abnormal) and her pulse is 65. Her respiration is 18 and oxygen saturation is 99%.   Wt Readings from Last 3 Encounters:  07/13/23 148 lb 1.9 oz (67.2 kg)  04/13/23 149 lb (67.6 kg)  01/18/23 147 lb (66.7 kg)    Ocular: Sclerae unicteric, pupils equal, round and reactive to light Ear-nose-throat: Oropharynx clear, dentition fair Lymphatic: No cervical or supraclavicular adenopathy Lungs no rales or rhonchi, good excursion bilaterally Heart regular rate and rhythm, no murmur appreciated Abd soft, nontender, positive bowel sounds MSK no focal spinal tenderness, no joint edema Neuro: non-focal, well-oriented, appropriate affect Breasts: Deferred   Lab Results  Component Value Date   WBC 10.1 07/13/2023   HGB 14.6 07/13/2023   HCT 47.6 (H) 07/13/2023   MCV 90.5 07/13/2023   PLT 307 07/13/2023  Lab Results  Component Value Date   FERRITIN 6 (L) 04/13/2023   IRON 39 04/13/2023   TIBC 501 (H) 04/13/2023   UIBC 462 (H) 04/13/2023   IRONPCTSAT 8 (L) 04/13/2023   Lab Results  Component Value Date   RETICCTPCT 1.5 07/13/2023   RBC 5.21 (H) 07/13/2023   No results found for: KPAFRELGTCHN, LAMBDASER, KAPLAMBRATIO No results found for: IGGSERUM, IGA, IGMSERUM No results found for: STEPHANY CARLOTA BENSON MARKEL EARLA JOANNIE DOC VICK, SPEI   Chemistry      Component Value Date/Time   NA 141 04/13/2023 1312   K 5.1 04/13/2023 1312   CL 102  04/13/2023 1312   CO2 30 04/13/2023 1312   BUN 11 04/13/2023 1312   CREATININE 0.60 04/13/2023 1312      Component Value Date/Time   CALCIUM 9.3 04/13/2023 1312   ALKPHOS 80 04/13/2023 1312   AST 42 (H) 04/13/2023 1312   ALT 37 04/13/2023 1312   BILITOT 0.4 04/13/2023 1312       Impression and Plan: Abigail Mullins is a very pleasant 73 yo caucasian female with polycythemia, secondary. JAK 2 and hemochromatosis DNA were both negative.  Phlebotomy today for Hct 47.6%.  Follow-up in another 3 months.   Lauraine Pepper, NP 4/18/20251:53 PM

## 2023-07-13 NOTE — Patient Instructions (Signed)

## 2023-07-17 DIAGNOSIS — L814 Other melanin hyperpigmentation: Secondary | ICD-10-CM | POA: Diagnosis not present

## 2023-07-17 DIAGNOSIS — Z85828 Personal history of other malignant neoplasm of skin: Secondary | ICD-10-CM | POA: Diagnosis not present

## 2023-07-17 DIAGNOSIS — L905 Scar conditions and fibrosis of skin: Secondary | ICD-10-CM | POA: Diagnosis not present

## 2023-07-17 DIAGNOSIS — L821 Other seborrheic keratosis: Secondary | ICD-10-CM | POA: Diagnosis not present

## 2023-07-17 DIAGNOSIS — L570 Actinic keratosis: Secondary | ICD-10-CM | POA: Diagnosis not present

## 2023-09-04 DIAGNOSIS — G894 Chronic pain syndrome: Secondary | ICD-10-CM | POA: Diagnosis not present

## 2023-09-04 DIAGNOSIS — F1120 Opioid dependence, uncomplicated: Secondary | ICD-10-CM | POA: Diagnosis not present

## 2023-09-19 DIAGNOSIS — M545 Low back pain, unspecified: Secondary | ICD-10-CM | POA: Diagnosis not present

## 2023-09-19 DIAGNOSIS — G8929 Other chronic pain: Secondary | ICD-10-CM | POA: Diagnosis not present

## 2023-09-19 DIAGNOSIS — Z79899 Other long term (current) drug therapy: Secondary | ICD-10-CM | POA: Diagnosis not present

## 2023-09-19 DIAGNOSIS — M546 Pain in thoracic spine: Secondary | ICD-10-CM | POA: Diagnosis not present

## 2023-09-19 DIAGNOSIS — F1120 Opioid dependence, uncomplicated: Secondary | ICD-10-CM | POA: Diagnosis not present

## 2023-09-21 DIAGNOSIS — Z79899 Other long term (current) drug therapy: Secondary | ICD-10-CM | POA: Diagnosis not present

## 2023-10-09 ENCOUNTER — Inpatient Hospital Stay

## 2023-10-09 ENCOUNTER — Encounter: Payer: Self-pay | Admitting: Family

## 2023-10-09 ENCOUNTER — Inpatient Hospital Stay: Admitting: Family

## 2023-10-09 ENCOUNTER — Inpatient Hospital Stay: Attending: Hematology & Oncology

## 2023-10-09 VITALS — BP 145/76 | HR 75 | Temp 98.4°F | Resp 18 | Ht 67.0 in | Wt 141.2 lb

## 2023-10-09 VITALS — BP 118/71 | HR 94

## 2023-10-09 DIAGNOSIS — D751 Secondary polycythemia: Secondary | ICD-10-CM | POA: Diagnosis not present

## 2023-10-09 DIAGNOSIS — D500 Iron deficiency anemia secondary to blood loss (chronic): Secondary | ICD-10-CM | POA: Insufficient documentation

## 2023-10-09 LAB — CBC WITH DIFFERENTIAL (CANCER CENTER ONLY)
Abs Immature Granulocytes: 0.08 K/uL — ABNORMAL HIGH (ref 0.00–0.07)
Basophils Absolute: 0 K/uL (ref 0.0–0.1)
Basophils Relative: 1 %
Eosinophils Absolute: 0.1 K/uL (ref 0.0–0.5)
Eosinophils Relative: 1 %
HCT: 46.1 % — ABNORMAL HIGH (ref 36.0–46.0)
Hemoglobin: 13.6 g/dL (ref 12.0–15.0)
Immature Granulocytes: 1 %
Lymphocytes Relative: 24 %
Lymphs Abs: 2 K/uL (ref 0.7–4.0)
MCH: 25.6 pg — ABNORMAL LOW (ref 26.0–34.0)
MCHC: 29.5 g/dL — ABNORMAL LOW (ref 30.0–36.0)
MCV: 86.7 fL (ref 80.0–100.0)
Monocytes Absolute: 0.9 K/uL (ref 0.1–1.0)
Monocytes Relative: 11 %
Neutro Abs: 5.3 K/uL (ref 1.7–7.7)
Neutrophils Relative %: 62 %
Platelet Count: 339 K/uL (ref 150–400)
RBC: 5.32 MIL/uL — ABNORMAL HIGH (ref 3.87–5.11)
RDW: 14.6 % (ref 11.5–15.5)
WBC Count: 8.4 K/uL (ref 4.0–10.5)
nRBC: 0 % (ref 0.0–0.2)

## 2023-10-09 LAB — RETICULOCYTES
Immature Retic Fract: 12 % (ref 2.3–15.9)
RBC.: 5.36 MIL/uL — ABNORMAL HIGH (ref 3.87–5.11)
Retic Count, Absolute: 52.5 K/uL (ref 19.0–186.0)
Retic Ct Pct: 1 % (ref 0.4–3.1)

## 2023-10-09 LAB — CMP (CANCER CENTER ONLY)
ALT: 8 U/L (ref 0–44)
AST: 17 U/L (ref 15–41)
Albumin: 4.4 g/dL (ref 3.5–5.0)
Alkaline Phosphatase: 65 U/L (ref 38–126)
Anion gap: 9 (ref 5–15)
BUN: 10 mg/dL (ref 8–23)
CO2: 29 mmol/L (ref 22–32)
Calcium: 9.8 mg/dL (ref 8.9–10.3)
Chloride: 103 mmol/L (ref 98–111)
Creatinine: 0.63 mg/dL (ref 0.44–1.00)
GFR, Estimated: >60 mL/min (ref >60)
Glucose, Bld: 110 mg/dL — ABNORMAL HIGH (ref 70–99)
Potassium: 5.2 mmol/L — ABNORMAL HIGH (ref 3.5–5.1)
Sodium: 141 mmol/L (ref 135–145)
Total Bilirubin: 0.4 mg/dL (ref 0.0–1.2)
Total Protein: 7.4 g/dL (ref 6.5–8.1)

## 2023-10-09 LAB — IRON AND IRON BINDING CAPACITY (CC-WL,HP ONLY)
Iron: 55 ug/dL (ref 28–170)
Saturation Ratios: 11 % (ref 10.4–31.8)
TIBC: 484 ug/dL — ABNORMAL HIGH (ref 250–450)
UIBC: 429 ug/dL

## 2023-10-09 LAB — FERRITIN: Ferritin: 10 ng/mL — ABNORMAL LOW (ref 11–307)

## 2023-10-09 NOTE — Patient Instructions (Signed)

## 2023-10-09 NOTE — Progress Notes (Signed)
 Hematology and Oncology Follow Up Visit  Abigail Mullins [id number] 23-Mar-1951 73 y.o. 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 is here today for follow-up and phlebotomy. Hct today is 46%.  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 Her biggest issue is her chronic back and joint pain. She still gets out and does the things she enjoys as she can.  No issue with fever, chills, n/v, cough, rash, dizziness, chest pain, palpitations, abdominal pain or changes in bowel or bladder habits.  No swelling in her extremities at this time.  No falls or syncope reported.  Appetite and hydration are good. Weight is stable at 141 lbs.   ECOG Performance Status: 1 - Symptomatic but completely ambulatory  Medications:  Allergies as of 10/09/2023   No Known Allergies      Medication List        Accurate as of October 09, 2023  1:23 PM. If you have any questions, ask your nurse or doctor.          STOP taking these medications    morphine 30 MG 12 hr tablet Commonly known as: MS CONTIN Stopped by: Lauraine Pepper       TAKE these medications    amLODipine 5 MG tablet Commonly known as: NORVASC Take 5 mg by mouth daily.   celecoxib 200 MG capsule Commonly known as: CELEBREX Take 200 mg by mouth daily.   estradiol 0.1 MG/GM vaginal cream Commonly known as: ESTRACE Place 1 Applicatorful vaginally as directed. 1 gram of cream in vagina on Monday and Thursday nights   fentaNYL 25 MCG/HR Commonly known as: DURAGESIC Place 1 patch onto the skin every 3 (three) days. This is from the pain clinic   fexofenadine 60 MG tablet Commonly known as: ALLEGRA Take 60 mg by mouth as needed.   ibuprofen 800 MG tablet Commonly known as: ADVIL Take 800 mg by mouth as needed.   metoprolol succinate 25 MG 24 hr tablet Commonly  known as: TOPROL-XL Take 25 mg by mouth daily.   oxyCODONE-acetaminophen 10-325 MG tablet Commonly known as: PERCOCET Take 1 tablet by mouth every 4 (four) hours as needed for pain.   polyethylene glycol 17 g packet Commonly known as: MIRALAX / GLYCOLAX Mix 1 capful in 8 ounces of fluid   SYSTANE ULTRA PF OP Apply to eye as needed.        Allergies: No Known Allergies  Past Medical History, Surgical history, Social history, and Family History were reviewed and updated.  Review of Systems: All other 10 point review of systems is negative.   Physical Exam:  height is 5' 7 (1.702 m) and weight is 141 lb 3.2 oz (64 kg). Her oral temperature is 98.4 F (36.9 C). Her blood pressure is 145/76 (abnormal) and her pulse is 75. Her respiration is 18 and oxygen saturation is 100%.   Wt Readings from Last 3 Encounters:  10/09/23 141 lb 3.2 oz (64 kg)  07/13/23 148 lb 1.9 oz (67.2 kg)  04/13/23 149 lb (67.6 kg)    Ocular: Sclerae unicteric, pupils equal, round and reactive to light Ear-nose-throat: Oropharynx clear, dentition fair Lymphatic: No cervical or supraclavicular adenopathy Lungs no rales or rhonchi, good excursion bilaterally Heart regular rate and rhythm, no murmur appreciated Abd soft, nontender, positive bowel sounds  MSK no focal spinal tenderness, no joint edema Neuro: non-focal, well-oriented, appropriate affect Breasts: Deferred   Lab Results  Component Value Date   WBC 8.4 10/09/2023   HGB 13.6 10/09/2023   HCT 46.1 (H) 10/09/2023   MCV 86.7 10/09/2023   PLT 339 10/09/2023   Lab Results  Component Value Date   FERRITIN 5 (L) 07/13/2023   IRON 37 07/13/2023   TIBC 491 (H) 07/13/2023   UIBC 454 (H) 07/13/2023   IRONPCTSAT 8 (L) 07/13/2023   Lab Results  Component Value Date   RETICCTPCT 1.5 07/13/2023   RBC 5.32 (H) 10/09/2023   No results found for: KPAFRELGTCHN, LAMBDASER, KAPLAMBRATIO No results found for: IGGSERUM, IGA, IGMSERUM No  results found for: STEPHANY CARLOTA BENSON MARKEL EARLA JOANNIE DOC VICK, SPEI   Chemistry      Component Value Date/Time   NA 140 07/13/2023 1305   K 5.3 (H) 07/13/2023 1305   CL 103 07/13/2023 1305   CO2 28 07/13/2023 1305   BUN 11 07/13/2023 1305   CREATININE 0.57 07/13/2023 1305      Component Value Date/Time   CALCIUM 9.8 07/13/2023 1305   ALKPHOS 67 07/13/2023 1305   AST 16 07/13/2023 1305   ALT 7 07/13/2023 1305   BILITOT 0.3 07/13/2023 1305       Impression and Plan: [name] is a very pleasant 73 yo caucasian female with polycythemia, secondary. JAK 2 and hemochromatosis DNA were both negative.  Phlebotomy today for Hct 46%.  Follow-up in another 3 months.    Lauraine Pepper, NP 7/15/20251:23 PM

## 2023-10-09 NOTE — Progress Notes (Signed)
 Abigail Mullins presents today for phlebotomy per MD orders. Phlebotomy procedure started at 1325 and ended at 1355. 500 grams removed. Patient observed for 30 minutes after procedure without any incident. Patient tolerated procedure well. IV needle removed intact.

## 2023-10-12 ENCOUNTER — Ambulatory Visit: Admitting: Family

## 2023-10-12 ENCOUNTER — Encounter

## 2023-10-12 ENCOUNTER — Inpatient Hospital Stay

## 2023-10-24 DIAGNOSIS — M546 Pain in thoracic spine: Secondary | ICD-10-CM | POA: Diagnosis not present

## 2023-10-24 DIAGNOSIS — F1120 Opioid dependence, uncomplicated: Secondary | ICD-10-CM | POA: Diagnosis not present

## 2023-10-24 DIAGNOSIS — M545 Low back pain, unspecified: Secondary | ICD-10-CM | POA: Diagnosis not present

## 2023-10-24 DIAGNOSIS — Z79899 Other long term (current) drug therapy: Secondary | ICD-10-CM | POA: Diagnosis not present

## 2023-10-24 DIAGNOSIS — G8929 Other chronic pain: Secondary | ICD-10-CM | POA: Diagnosis not present

## 2023-10-26 DIAGNOSIS — Z79899 Other long term (current) drug therapy: Secondary | ICD-10-CM | POA: Diagnosis not present

## 2023-11-21 DIAGNOSIS — Z79899 Other long term (current) drug therapy: Secondary | ICD-10-CM | POA: Diagnosis not present

## 2023-11-21 DIAGNOSIS — M546 Pain in thoracic spine: Secondary | ICD-10-CM | POA: Diagnosis not present

## 2023-11-21 DIAGNOSIS — G8929 Other chronic pain: Secondary | ICD-10-CM | POA: Diagnosis not present

## 2023-11-21 DIAGNOSIS — F1120 Opioid dependence, uncomplicated: Secondary | ICD-10-CM | POA: Diagnosis not present

## 2023-11-21 DIAGNOSIS — M545 Low back pain, unspecified: Secondary | ICD-10-CM | POA: Diagnosis not present

## 2023-12-12 DIAGNOSIS — F1120 Opioid dependence, uncomplicated: Secondary | ICD-10-CM | POA: Diagnosis not present

## 2023-12-24 DIAGNOSIS — F1120 Opioid dependence, uncomplicated: Secondary | ICD-10-CM | POA: Diagnosis not present

## 2023-12-24 DIAGNOSIS — G8929 Other chronic pain: Secondary | ICD-10-CM | POA: Diagnosis not present

## 2023-12-24 DIAGNOSIS — M545 Low back pain, unspecified: Secondary | ICD-10-CM | POA: Diagnosis not present

## 2023-12-24 DIAGNOSIS — Z79899 Other long term (current) drug therapy: Secondary | ICD-10-CM | POA: Diagnosis not present

## 2023-12-24 DIAGNOSIS — M546 Pain in thoracic spine: Secondary | ICD-10-CM | POA: Diagnosis not present

## 2024-01-09 ENCOUNTER — Inpatient Hospital Stay: Attending: Hematology & Oncology

## 2024-01-09 ENCOUNTER — Inpatient Hospital Stay

## 2024-01-09 ENCOUNTER — Encounter: Payer: Self-pay | Admitting: Family

## 2024-01-09 ENCOUNTER — Inpatient Hospital Stay: Admitting: Family

## 2024-01-09 VITALS — BP 134/67 | HR 64 | Temp 97.8°F | Resp 18 | Ht 67.0 in | Wt 142.4 lb

## 2024-01-09 DIAGNOSIS — D500 Iron deficiency anemia secondary to blood loss (chronic): Secondary | ICD-10-CM

## 2024-01-09 DIAGNOSIS — D751 Secondary polycythemia: Secondary | ICD-10-CM | POA: Insufficient documentation

## 2024-01-09 DIAGNOSIS — D509 Iron deficiency anemia, unspecified: Secondary | ICD-10-CM | POA: Insufficient documentation

## 2024-01-09 LAB — CBC WITH DIFFERENTIAL (CANCER CENTER ONLY)
Abs Immature Granulocytes: 0.02 K/uL (ref 0.00–0.07)
Basophils Absolute: 0 K/uL (ref 0.0–0.1)
Basophils Relative: 0 %
Eosinophils Absolute: 0 K/uL (ref 0.0–0.5)
Eosinophils Relative: 0 %
HCT: 44.1 % (ref 36.0–46.0)
Hemoglobin: 13.1 g/dL (ref 12.0–15.0)
Immature Granulocytes: 0 %
Lymphocytes Relative: 18 %
Lymphs Abs: 1.7 K/uL (ref 0.7–4.0)
MCH: 25.5 pg — ABNORMAL LOW (ref 26.0–34.0)
MCHC: 29.7 g/dL — ABNORMAL LOW (ref 30.0–36.0)
MCV: 86 fL (ref 80.0–100.0)
Monocytes Absolute: 0.8 K/uL (ref 0.1–1.0)
Monocytes Relative: 8 %
Neutro Abs: 7.1 K/uL (ref 1.7–7.7)
Neutrophils Relative %: 74 %
Platelet Count: 314 K/uL (ref 150–400)
RBC: 5.13 MIL/uL — ABNORMAL HIGH (ref 3.87–5.11)
RDW: 14.8 % (ref 11.5–15.5)
WBC Count: 9.7 K/uL (ref 4.0–10.5)
nRBC: 0 % (ref 0.0–0.2)

## 2024-01-09 LAB — CMP (CANCER CENTER ONLY)
ALT: 7 U/L (ref 0–44)
AST: 19 U/L (ref 15–41)
Albumin: 4.4 g/dL (ref 3.5–5.0)
Alkaline Phosphatase: 87 U/L (ref 38–126)
Anion gap: 10 (ref 5–15)
BUN: 10 mg/dL (ref 8–23)
CO2: 28 mmol/L (ref 22–32)
Calcium: 9.4 mg/dL (ref 8.9–10.3)
Chloride: 100 mmol/L (ref 98–111)
Creatinine: 0.57 mg/dL (ref 0.44–1.00)
GFR, Estimated: >60 mL/min (ref >60)
Glucose, Bld: 117 mg/dL — ABNORMAL HIGH (ref 70–99)
Potassium: 4.1 mmol/L (ref 3.5–5.1)
Sodium: 138 mmol/L (ref 135–145)
Total Bilirubin: 0.3 mg/dL (ref 0.0–1.2)
Total Protein: 7.3 g/dL (ref 6.5–8.1)

## 2024-01-09 LAB — IRON AND IRON BINDING CAPACITY (CC-WL,HP ONLY)
Iron: 27 ug/dL — ABNORMAL LOW (ref 28–170)
Saturation Ratios: 6 % — ABNORMAL LOW (ref 10.4–31.8)
TIBC: 490 ug/dL — ABNORMAL HIGH (ref 250–450)
UIBC: 463 ug/dL

## 2024-01-09 LAB — RETICULOCYTES
Immature Retic Fract: 16.8 % — ABNORMAL HIGH (ref 2.3–15.9)
RBC.: 5.04 MIL/uL (ref 3.87–5.11)
Retic Count, Absolute: 64 K/uL (ref 19.0–186.0)
Retic Ct Pct: 1.3 % (ref 0.4–3.1)

## 2024-01-09 LAB — FERRITIN: Ferritin: 9 ng/mL — ABNORMAL LOW (ref 11–307)

## 2024-01-09 NOTE — Progress Notes (Signed)
 Hematology and Oncology Follow Up Visit  Abigail Mullins [id number] 06/24/1950 73 y.o. 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 She is feeling fatigued and states that her chronic back pain keeps her from being able to sleep.  No fever, chills, n/v, cough, rash, dizziness, SOB, chest pain, palpitations, abdominal pain or changes in bowel or bladder habits.  No swelling, tenderness, numbness or tingling in her extremities.  No falls or syncope.  Appetite and hydration are good. Weight is stable at 196 lbs.   ECOG Performance Status: 1 - Symptomatic but completely ambulatory  Medications:  Allergies as of 01/09/2024   No Known Allergies      Medication List        Accurate as of January 09, 2024  1:40 PM. If you have any questions, ask your nurse or doctor.          STOP taking these medications    estradiol 0.1 MG/GM vaginal cream Commonly known as: ESTRACE Stopped by: Lauraine Pepper       TAKE these medications    amLODipine 5 MG tablet Commonly known as: NORVASC Take 5 mg by mouth daily.   celecoxib 200 MG capsule Commonly known as: CELEBREX Take 200 mg by mouth daily.   fentaNYL 25 MCG/HR Commonly known as: DURAGESIC Place 1 patch onto the skin every 3 (three) days. This is from the pain clinic   fexofenadine 60 MG tablet Commonly known as: ALLEGRA Take 60 mg by mouth as needed.   ibuprofen 800 MG tablet Commonly known as: ADVIL Take 800 mg by mouth as needed.   metoprolol succinate 25 MG 24 hr tablet Commonly known as: TOPROL-XL Take 25 mg by mouth daily.   oxyCODONE-acetaminophen 10-325 MG tablet Commonly known as: PERCOCET Take 1 tablet by mouth every 4 (four) hours as needed for pain.   polyethylene glycol 17 g packet Commonly  known as: MIRALAX / GLYCOLAX Mix 1 capful in 8 ounces of fluid   SYSTANE ULTRA PF OP Apply to eye as needed.        Allergies: No Known Allergies  Past Medical History, Surgical history, Social history, and Family History were reviewed and updated.  Review of Systems: All other 10 point review of systems is negative.   Physical Exam:  height is 5' 7 (1.702 m) and weight is 142 lb 6.4 oz (64.6 kg). Her oral temperature is 97.8 F (36.6 C). Her blood pressure is 134/67 and her pulse is 64. Her respiration is 18 and oxygen saturation is 100%.   Wt Readings from Last 3 Encounters:  01/09/24 142 lb 6.4 oz (64.6 kg)  10/09/23 141 lb 3.2 oz (64 kg)  07/13/23 148 lb 1.9 oz (67.2 kg)    Ocular: Sclerae unicteric, pupils equal, round and reactive to light Ear-nose-throat: Oropharynx clear, dentition fair Lymphatic: No cervical or supraclavicular adenopathy Lungs no rales or rhonchi, good excursion bilaterally Heart regular rate and rhythm, no murmur appreciated Abd soft, nontender, positive bowel sounds MSK no focal spinal tenderness, no joint edema Neuro: non-focal, well-oriented, appropriate affect Breasts: Deferred   Lab Results  Component Value Date   WBC 9.7 01/09/2024   HGB 13.1 01/09/2024   HCT 44.1 01/09/2024   MCV 86.0 01/09/2024   PLT 314 01/09/2024  Lab Results  Component Value Date   FERRITIN 10 (L) 10/09/2023   IRON 55 10/09/2023   TIBC 484 (H) 10/09/2023   UIBC 429 10/09/2023   IRONPCTSAT 11 10/09/2023   Lab Results  Component Value Date   RETICCTPCT 1.3 01/09/2024   RBC 5.13 (H) 01/09/2024   No results found for: KPAFRELGTCHN, LAMBDASER, KAPLAMBRATIO No results found for: IGGSERUM, IGA, IGMSERUM No results found for: STEPHANY RINGS, A1GS, A2GS, EARLA JOANNIE DOC VICK, SPEI   Chemistry      Component Value Date/Time   NA 141 10/09/2023 1235   K 5.2 (H) 10/09/2023 1235   CL 103 10/09/2023 1235   CO2  29 10/09/2023 1235   BUN 10 10/09/2023 1235   CREATININE 0.63 10/09/2023 1235      Component Value Date/Time   CALCIUM 9.8 10/09/2023 1235   ALKPHOS 65 10/09/2023 1235   AST 17 10/09/2023 1235   ALT 8 10/09/2023 1235   BILITOT 0.4 10/09/2023 1235       Impression and Plan: Abigail Mullins is a very pleasant 73 yo caucasian female with polycythemia, secondary. JAK 2 and hemochromatosis DNA were both negative.  No phlebotomy today, Hct 44%.  Follow-up in another 2 months.   Lauraine Pepper, NP 10/15/20251:40 PM

## 2024-01-21 DIAGNOSIS — G8929 Other chronic pain: Secondary | ICD-10-CM | POA: Diagnosis not present

## 2024-01-21 DIAGNOSIS — M546 Pain in thoracic spine: Secondary | ICD-10-CM | POA: Diagnosis not present

## 2024-01-21 DIAGNOSIS — Z79899 Other long term (current) drug therapy: Secondary | ICD-10-CM | POA: Diagnosis not present

## 2024-01-21 DIAGNOSIS — F1120 Opioid dependence, uncomplicated: Secondary | ICD-10-CM | POA: Diagnosis not present

## 2024-01-21 DIAGNOSIS — M545 Low back pain, unspecified: Secondary | ICD-10-CM | POA: Diagnosis not present

## 2024-02-05 DIAGNOSIS — I10 Essential (primary) hypertension: Secondary | ICD-10-CM | POA: Diagnosis not present

## 2024-02-05 DIAGNOSIS — D751 Secondary polycythemia: Secondary | ICD-10-CM | POA: Diagnosis not present

## 2024-02-05 DIAGNOSIS — R5383 Other fatigue: Secondary | ICD-10-CM | POA: Diagnosis not present

## 2024-02-05 DIAGNOSIS — D582 Other hemoglobinopathies: Secondary | ICD-10-CM | POA: Diagnosis not present

## 2024-02-05 DIAGNOSIS — M549 Dorsalgia, unspecified: Secondary | ICD-10-CM | POA: Diagnosis not present

## 2024-02-05 DIAGNOSIS — Z0000 Encounter for general adult medical examination without abnormal findings: Secondary | ICD-10-CM | POA: Diagnosis not present

## 2024-02-05 DIAGNOSIS — Z136 Encounter for screening for cardiovascular disorders: Secondary | ICD-10-CM | POA: Diagnosis not present

## 2024-02-05 DIAGNOSIS — Z23 Encounter for immunization: Secondary | ICD-10-CM | POA: Diagnosis not present

## 2024-02-18 DIAGNOSIS — M546 Pain in thoracic spine: Secondary | ICD-10-CM | POA: Diagnosis not present

## 2024-02-18 DIAGNOSIS — M545 Low back pain, unspecified: Secondary | ICD-10-CM | POA: Diagnosis not present

## 2024-02-18 DIAGNOSIS — F1120 Opioid dependence, uncomplicated: Secondary | ICD-10-CM | POA: Diagnosis not present

## 2024-02-18 DIAGNOSIS — Z79899 Other long term (current) drug therapy: Secondary | ICD-10-CM | POA: Diagnosis not present

## 2024-03-12 ENCOUNTER — Inpatient Hospital Stay

## 2024-03-12 ENCOUNTER — Inpatient Hospital Stay: Admitting: Family

## 2024-04-09 ENCOUNTER — Inpatient Hospital Stay: Admitting: Family

## 2024-04-09 ENCOUNTER — Inpatient Hospital Stay

## 2024-04-16 ENCOUNTER — Inpatient Hospital Stay

## 2024-04-16 ENCOUNTER — Inpatient Hospital Stay: Admitting: Family

## 2024-04-16 ENCOUNTER — Inpatient Hospital Stay: Attending: Hematology & Oncology

## 2024-04-16 VITALS — BP 127/71 | HR 69 | Temp 97.5°F | Resp 16 | Ht 67.0 in | Wt 146.4 lb

## 2024-04-16 DIAGNOSIS — D751 Secondary polycythemia: Secondary | ICD-10-CM

## 2024-04-16 DIAGNOSIS — D500 Iron deficiency anemia secondary to blood loss (chronic): Secondary | ICD-10-CM

## 2024-04-16 LAB — CBC WITH DIFFERENTIAL (CANCER CENTER ONLY)
Abs Immature Granulocytes: 0 K/uL (ref 0.00–0.07)
Band Neutrophils: 0 %
Basophils Absolute: 0 K/uL (ref 0.0–0.1)
Basophils Relative: 0 %
Eosinophils Absolute: 0 K/uL (ref 0.0–0.5)
Eosinophils Relative: 0 %
HCT: 44.3 % (ref 36.0–46.0)
Hemoglobin: 13.8 g/dL (ref 12.0–15.0)
Immature Granulocytes: 0 %
Lymphocytes Relative: 12 %
Lymphs Abs: 1.6 K/uL (ref 0.7–4.0)
MCH: 27.5 pg (ref 26.0–34.0)
MCHC: 31.2 g/dL (ref 30.0–36.0)
MCV: 88.2 fL (ref 80.0–100.0)
Monocytes Absolute: 0.9 K/uL (ref 0.1–1.0)
Monocytes Relative: 7 %
Neutro Abs: 10 K/uL — ABNORMAL HIGH (ref 1.7–7.7)
Neutrophils Relative %: 79 %
Platelet Count: 349 K/uL (ref 150–400)
RBC: 5.02 MIL/uL (ref 3.87–5.11)
RDW: 15.1 % (ref 11.5–15.5)
WBC Count: 12.6 K/uL — ABNORMAL HIGH (ref 4.0–10.5)
nRBC: 0 % (ref 0.0–0.2)
nRBC: 0 /100{WBCs}

## 2024-04-16 LAB — CMP (CANCER CENTER ONLY)
ALT: 26 U/L (ref 0–44)
AST: 29 U/L (ref 15–41)
Albumin: 4.4 g/dL (ref 3.5–5.0)
Alkaline Phosphatase: 72 U/L (ref 38–126)
Anion gap: 12 (ref 5–15)
BUN: 18 mg/dL (ref 8–23)
CO2: 26 mmol/L (ref 22–32)
Calcium: 9.5 mg/dL (ref 8.9–10.3)
Chloride: 101 mmol/L (ref 98–111)
Creatinine: 0.59 mg/dL (ref 0.44–1.00)
GFR, Estimated: >60 mL/min
Glucose, Bld: 103 mg/dL — ABNORMAL HIGH (ref 70–99)
Potassium: 4.6 mmol/L (ref 3.5–5.1)
Sodium: 139 mmol/L (ref 135–145)
Total Bilirubin: 0.3 mg/dL (ref 0.0–1.2)
Total Protein: 7.6 g/dL (ref 6.5–8.1)

## 2024-04-16 LAB — IRON AND IRON BINDING CAPACITY (CC-WL,HP ONLY)
Iron: 54 ug/dL (ref 28–170)
Saturation Ratios: 11 % (ref 10.4–31.8)
TIBC: 470 ug/dL — ABNORMAL HIGH (ref 250–450)
UIBC: 417 ug/dL

## 2024-04-16 LAB — RETICULOCYTES
Immature Retic Fract: 9.8 % (ref 2.3–15.9)
RBC.: 5.13 MIL/uL — ABNORMAL HIGH (ref 3.87–5.11)
Retic Count, Absolute: 80 K/uL (ref 19.0–186.0)
Retic Ct Pct: 1.6 % (ref 0.4–3.1)

## 2024-04-16 LAB — FERRITIN: Ferritin: 16 ng/mL (ref 11–307)

## 2024-04-16 NOTE — Progress Notes (Signed)
 " Hematology and Oncology Follow Up Visit  Abigail Mullins [id number] [birth year] 74 y.o. 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 is here today for follow-up. She is doing fairly well. He biggest issue is the chronic back pain which she has learned to live with.  No falls or syncope reported.  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  She has chronic low iron from phlebotomies and is symptomatic with loss of appetite and persistent fatigue.  We will increase her Hct parameter to 48% to try and improve her symptoms.  No fever, chills, n/v, cough, rash, dizziness, chest pain, palpitations, abdominal pain or changes in bowel or bladder habits.  SOB with exertion which she attributes to deconditioning.  Appetite and hydration are good weight is stable at 146 lbs.   ECOG Performance Status: 1 - Symptomatic but completely ambulatory  Medications:  Allergies as of 04/16/2024   No Known Allergies      Medication List        Accurate as of April 16, 2024  1:25 PM. If you have any questions, ask your nurse or doctor.          amLODipine 5 MG tablet Commonly known as: NORVASC Take 5 mg by mouth daily.   celecoxib 200 MG capsule Commonly known as: CELEBREX Take 200 mg by mouth daily.   fentaNYL 25 MCG/HR Commonly known as: DURAGESIC Place 1 patch onto the skin every 3 (three) days. This is from the pain clinic   fexofenadine 60 MG tablet Commonly known as: ALLEGRA Take 60 mg by mouth as needed.   ibuprofen 800 MG tablet Commonly known as: ADVIL Take 800 mg by mouth as needed.   metoprolol succinate 25 MG 24 hr tablet Commonly known as: TOPROL-XL Take 25 mg by mouth daily.   oxyCODONE-acetaminophen 10-325 MG tablet Commonly known as: PERCOCET Take 1 tablet by  mouth every 4 (four) hours as needed for pain.   polyethylene glycol 17 g packet Commonly known as: MIRALAX / GLYCOLAX Mix 1 capful in 8 ounces of fluid   SYSTANE ULTRA PF OP Apply to eye as needed.        Allergies: Allergies[1]  Past Medical History, Surgical history, Social history, and Family History were reviewed and updated.  Review of Systems: All other 10 point review of systems is negative.   Physical Exam:  height is 5' 7 (1.702 m) and weight is 146 lb 6.4 oz (66.4 kg).   Wt Readings from Last 3 Encounters:  04/16/24 146 lb 6.4 oz (66.4 kg)  01/09/24 142 lb 6.4 oz (64.6 kg)  10/09/23 141 lb 3.2 oz (64 kg)    Ocular: Sclerae unicteric, pupils equal, round and reactive to light Ear-nose-throat: Oropharynx clear, dentition fair Lymphatic: No cervical or supraclavicular adenopathy Lungs no rales or rhonchi, good excursion bilaterally Heart regular rate and rhythm, no murmur appreciated Abd soft, nontender, positive bowel sounds MSK no focal spinal tenderness, no joint edema Neuro: non-focal, well-oriented, appropriate affect Breasts: Deferred   Lab Results  Component Value Date   WBC 9.7 01/09/2024   HGB 13.1 01/09/2024   HCT 44.1 01/09/2024   MCV 86.0 01/09/2024   PLT 314 01/09/2024   Lab Results  Component Value Date   FERRITIN 9 (L) 01/09/2024  IRON 27 (L) 01/09/2024   TIBC 490 (H) 01/09/2024   UIBC 463 01/09/2024   IRONPCTSAT 6 (L) 01/09/2024   Lab Results  Component Value Date   RETICCTPCT 1.3 01/09/2024   RBC 5.13 (H) 01/09/2024   No results found for: KPAFRELGTCHN, LAMBDASER, KAPLAMBRATIO No results found for: IGGSERUM, IGA, IGMSERUM No results found for: STEPHANY CARLOTA BENSON MARKEL EARLA JOANNIE DOC VICK, SPEI   Chemistry      Component Value Date/Time   NA 138 01/09/2024 1259   K 4.1 01/09/2024 1259   CL 100 01/09/2024 1259   CO2 28 01/09/2024 1259   BUN 10 01/09/2024 1259    CREATININE 0.57 01/09/2024 1259      Component Value Date/Time   CALCIUM 9.4 01/09/2024 1259   ALKPHOS 87 01/09/2024 1259   AST 19 01/09/2024 1259   ALT 7 01/09/2024 1259   BILITOT 0.3 01/09/2024 1259       Impression and Plan: [name] is a very pleasant 74 yo caucasian female with polycythemia, secondary. JAK 2 and hemochromatosis DNA were both negative.  No phlebotomy today, Hct 44%.  Follow-up in another 2 months.   Lauraine Pepper, NP 1/21/20261:25 PM     [1] No Known Allergies  "

## 2024-06-11 ENCOUNTER — Inpatient Hospital Stay

## 2024-06-11 ENCOUNTER — Inpatient Hospital Stay: Admitting: Family
